# Patient Record
Sex: Female | Born: 1945 | Race: White | Hispanic: No | Smoking: Never smoker
Health system: Southern US, Community
[De-identification: ages and names within clinical notes are randomized; demographics above are authoritative.]

## PROBLEM LIST (undated history)

## (undated) DIAGNOSIS — K219 Gastro-esophageal reflux disease without esophagitis: Secondary | ICD-10-CM

## (undated) DIAGNOSIS — N83209 Unspecified ovarian cyst, unspecified side: Secondary | ICD-10-CM

## (undated) DIAGNOSIS — C50919 Malignant neoplasm of unspecified site of unspecified female breast: Secondary | ICD-10-CM

## (undated) DIAGNOSIS — R0600 Dyspnea, unspecified: Secondary | ICD-10-CM

## (undated) DIAGNOSIS — M419 Scoliosis, unspecified: Secondary | ICD-10-CM

## (undated) DIAGNOSIS — G20 Parkinson's disease: Secondary | ICD-10-CM

## (undated) DIAGNOSIS — F419 Anxiety disorder, unspecified: Secondary | ICD-10-CM

## (undated) DIAGNOSIS — M810 Age-related osteoporosis without current pathological fracture: Secondary | ICD-10-CM

## (undated) HISTORY — PX: CYSTOCELE REPAIR: SHX163

## (undated) HISTORY — PX: BREAST LUMPECTOMY: SHX2

## (undated) HISTORY — PX: WISDOM TOOTH EXTRACTION: SHX21

## (undated) HISTORY — PX: ABDOMINAL HYSTERECTOMY: SHX81

## (undated) HISTORY — PX: CHOLECYSTECTOMY: SHX55

## (undated) HISTORY — DX: Malignant neoplasm of unspecified site of unspecified female breast: C50.919

## (undated) HISTORY — PX: BREAST BIOPSY: SHX20

## (undated) HISTORY — DX: Anxiety disorder, unspecified: F41.9

## (undated) HISTORY — DX: Unspecified ovarian cyst, unspecified side: N83.209

---

## 1999-01-28 ENCOUNTER — Encounter: Payer: Self-pay | Admitting: Emergency Medicine

## 1999-01-28 ENCOUNTER — Emergency Department (HOSPITAL_COMMUNITY): Admission: EM | Admit: 1999-01-28 | Discharge: 1999-01-28 | Payer: Self-pay | Admitting: Emergency Medicine

## 1999-03-13 ENCOUNTER — Other Ambulatory Visit: Admission: RE | Admit: 1999-03-13 | Discharge: 1999-03-13 | Payer: Self-pay | Admitting: *Deleted

## 1999-05-25 ENCOUNTER — Encounter: Payer: Self-pay | Admitting: Surgery

## 1999-06-02 ENCOUNTER — Observation Stay (HOSPITAL_COMMUNITY): Admission: RE | Admit: 1999-06-02 | Discharge: 1999-06-03 | Payer: Self-pay | Admitting: Surgery

## 1999-06-02 ENCOUNTER — Encounter (INDEPENDENT_AMBULATORY_CARE_PROVIDER_SITE_OTHER): Payer: Self-pay

## 2000-03-25 ENCOUNTER — Other Ambulatory Visit: Admission: RE | Admit: 2000-03-25 | Discharge: 2000-03-25 | Payer: Self-pay | Admitting: *Deleted

## 2001-03-20 ENCOUNTER — Other Ambulatory Visit: Admission: RE | Admit: 2001-03-20 | Discharge: 2001-03-20 | Payer: Self-pay | Admitting: *Deleted

## 2001-08-08 ENCOUNTER — Encounter: Admission: RE | Admit: 2001-08-08 | Discharge: 2001-08-08 | Payer: Self-pay | Admitting: Urology

## 2001-08-08 ENCOUNTER — Encounter: Payer: Self-pay | Admitting: Urology

## 2003-02-07 ENCOUNTER — Other Ambulatory Visit: Admission: RE | Admit: 2003-02-07 | Discharge: 2003-02-07 | Payer: Self-pay | Admitting: *Deleted

## 2005-08-17 ENCOUNTER — Other Ambulatory Visit: Admission: RE | Admit: 2005-08-17 | Discharge: 2005-08-17 | Payer: Self-pay | Admitting: Obstetrics & Gynecology

## 2005-12-08 ENCOUNTER — Ambulatory Visit (HOSPITAL_COMMUNITY): Admission: RE | Admit: 2005-12-08 | Discharge: 2005-12-09 | Payer: Self-pay | Admitting: Obstetrics and Gynecology

## 2008-05-14 ENCOUNTER — Ambulatory Visit (HOSPITAL_COMMUNITY): Admission: RE | Admit: 2008-05-14 | Discharge: 2008-05-15 | Payer: Self-pay | Admitting: Urology

## 2010-08-24 LAB — TYPE AND SCREEN
ABO/RH(D): O POS
Antibody Screen: NEGATIVE

## 2010-08-24 LAB — CBC
HCT: 34.8 % — ABNORMAL LOW (ref 36.0–46.0)
Hemoglobin: 11.6 g/dL — ABNORMAL LOW (ref 12.0–15.0)
MCHC: 33.5 g/dL (ref 30.0–36.0)
MCHC: 33.6 g/dL (ref 30.0–36.0)
MCV: 100.8 fL — ABNORMAL HIGH (ref 78.0–100.0)
MCV: 101.1 fL — ABNORMAL HIGH (ref 78.0–100.0)
MCV: 103 fL — ABNORMAL HIGH (ref 78.0–100.0)
Platelets: 216 10*3/uL (ref 150–400)
RBC: 3.15 MIL/uL — ABNORMAL LOW (ref 3.87–5.11)
RBC: 3.44 MIL/uL — ABNORMAL LOW (ref 3.87–5.11)
RBC: 3.61 MIL/uL — ABNORMAL LOW (ref 3.87–5.11)
RDW: 13 % (ref 11.5–15.5)
WBC: 10.2 10*3/uL (ref 4.0–10.5)
WBC: 8.6 10*3/uL (ref 4.0–10.5)

## 2010-08-24 LAB — BASIC METABOLIC PANEL
BUN: 13 mg/dL (ref 6–23)
CO2: 26 mEq/L (ref 19–32)
Calcium: 8.6 mg/dL (ref 8.4–10.5)
Chloride: 103 mEq/L (ref 96–112)
Creatinine, Ser: 0.83 mg/dL (ref 0.4–1.2)
Glucose, Bld: 163 mg/dL — ABNORMAL HIGH (ref 70–99)

## 2010-08-24 LAB — ABO/RH: ABO/RH(D): O POS

## 2010-09-22 NOTE — Op Note (Signed)
Mary Simpson, HOOPES                ACCOUNT NO.:  [id number]   MEDICAL RECORD NO.:  [id number]          PATIENT TYPE:  AMB   LOCATION:  DAY                          FACILITY:  [facility]   PHYSICIAN:  Martina Sinner, MD DATE OF BIRTH:  [birth year]   DATE OF PROCEDURE:  05/14/2008  DATE OF DISCHARGE:                               OPERATIVE REPORT   PREOPERATIVE DIAGNOSES:  1. Cystocele.  2. Vault prolapse.  3. Pelvic pain.   POSTOPERATIVE DIAGNOSES:  1. Cystocele.  2. Vault prolapse.  3. Interstitial cystitis.   SURGERY:  Vault suspension plus cystocele repair plus graft plus  cystoscopy plus hydrodistention.   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  She was feared to have a  prolapse repair.  She had had an A and P repair in the past.   The patient is prepped and draped in usual fashion.  Extra care was  taken with leg position to minimize the risk of compartment syndrome,  neuropathy and DVT.  Preoperative lab tests were normal.  Positive blood  cultures treated with Bactrim followed by gentamicin and Unasyn  appropriately.  There is no question she had some vaginal shortening at  rest.  She had a cone effect at the apex of her vagina that I could  insert 3 fingers into but it was tight.  This in my opinion is from her  previous hysterectomy followed by an A and P repair.  I could identify  the dimples.  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  She had a very short anterior vaginal wall and was  very short to the pelvic sidewall.  Three-0 Vicryl was placed at the  dimple.   I used a T-shaped incision between 2 Allis clamps and used my usual  technique after instilling 20 mL of a lidocaine/epinephrine mixture.  I  sharply dissected the vaginal wall mucosa from the pubocervical fascia  bilaterally mobilizing the fascia at the apex and all the way to the  pelvic sidewall.  Again, the length and width of the dissection was  surprisingly small.  At first, I was not sure if I was going to be able  to fix much of the defect.   I decided to do a gentle one-layer imbrication anteriorly, avoiding the  bladder neck.  This was followed by 3 interrupted sutures without  further imbrication.   I then cystoscoped the patient.  There was good ureteral jets  bilaterally.  There is no injury to the bladder.   I then finger dissected the ischial spine bilaterally.  She had nice  spines, and the sacrospinous ligament was easily palpable.  Using a  catheter device, I placed a 0 Ethibond 1-1.5 cm cephalad and 1-cm  posterior to the ischial spine.  I double checked the position and was  very pleased with it.  I then placed a 0 Vicryl on a PT1  needle through  the pelvic sidewall near the urethrovesical angle.   I cut my 10 x 6 dermal graft.  This was sewn into the floor, sutures  described.  It went in beautifully.  I split it in the midline a half a  centimeter to avoid any restriction at the level of the bladder neck.   A few millimeters was removed in the middle aspect of the anterior  vaginal wall mucosa.  Two-0 Vicryl was used to close the anterior  vaginal wall incision.  Copious irrigation was utilized.   The cone effect at the top of the vagina was a little bit tighter, but I  could insert 3 fingers.  I did not trim any vaginal wall mucosa at this  level.  I was very careful to close the mucosa, passing my needle  through the edges, not to narrow further.  In my opinion, the further  narrowing was due to closing the T-shaped incision and not from deeper  dissection.   Vaginal pack with Estrace was inserted.   Visibly and palpably, I was very pleased with the operation and  hopefully will reach the treatment goal.           ______________________________  Martina Sinner, MD  Electronically Signed     SAM/MEDQ  D:  05/14/2008  T:  05/14/2008  Job:  [id number]

## 2010-09-22 NOTE — Op Note (Signed)
Mary Simpson, Mary Simpson                ACCOUNT NO.:  [id number]   MEDICAL RECORD NO.:  [id number]          PATIENT TYPE:  OIB   LOCATION:  1401                         FACILITY:  [facility]   PHYSICIAN:  Martina Sinner, MD DATE OF BIRTH:  1945-07-12   DATE OF PROCEDURE:  05/14/2008  DATE OF DISCHARGE:                               OPERATIVE REPORT   PREOPERATIVE DIAGNOSES:  Pelvic prolapse, pelvic pain.   POSTOPERATIVE DIAGNOSES:  Pelvic prolapse, interstitial cystitis.   TITLE OF PROCEDURE:  Cystoscopy with hydrodistention.   I have dictated a lengthy note regarding this patient's surgery.  At the  beginning of the case, I did a cystoscopy.  Bladder mucosa and trigone  were normal.  There was no__________ of bladder carcinoma.  She was  hydrodistended to 700cc.  I emptied the bladder and re-examined the  bladder and she had a few glomerulations of mild to moderate degree.  Quite frankly, this surprised me and I will take this into consideration  when treating her.  She does have a pain syndrome which I thought was  more of a vaginitis.  She had cystoscopic findings in keeping with the  diagnosis of interstitial cystitis.           ______________________________  Martina Sinner, MD  Electronically Signed     SAM/MEDQ  D:  05/14/2008  T:  05/14/2008  Job:  [id number]

## 2010-09-25 NOTE — Op Note (Signed)
NAMEDNIYA, NEUHAUS                ACCOUNT NO.:  [id number]   MEDICAL RECORD NO.:  [id number]          PATIENT TYPE:  AMB   LOCATION:  SDC                           FACILITY:  [facility]   PHYSICIAN:  Michelle L. Grewal, M.D.DATE OF BIRTH:  December 10, 1945   DATE OF PROCEDURE:  12/08/2005  DATE OF DISCHARGE:                                 OPERATIVE REPORT   PREOPERATIVE DIAGNOSIS:  Cystocele and rectocele.   POSTOPERATIVE DIAGNOSIS:  Cystocele and rectocele.   PROCEDURE:  Anterior and posterior repair.   SURGEON:  Dr. Marcelle Overlie.   ANESTHESIA:  General.   ESTIMATED BLOOD LOSS:  Minimal.   COMPLICATIONS:  None.   PROCEDURE:  The patient is taken to the operating room.  She is intubated  without difficulty.  She is prepped and draped in usual sterile fashion.  Exam under anesthesia revealed grade 3 cystocele and high rectocele grade 1  to 2.  She had excellent vaginal vault support and excellent support of the  perineum.  A Foley catheter is inserted.  A speculum was placed in the  vagina and Allis clamps were placed up near the vaginal apex on either side  of the cystocele.  A small incision was made and a midline incision is made  in the anterior vaginal vault and carried over to the UV junction.  The  cystocele was reduced using sharp and blunt dissection with excellent  hemostasis.  It was then reduced using two pursestring sutures using O  Vicryl suture.  We then trimmed the redundant vaginal epithelium and then  closed the vaginal vault using 2-0 Vicryl continuous running locked stitch.  At this point an exam revealed she did have some bulging of the posterior  vaginal wall near the apex, but it was not an enterocele.  It appeared to be  high rectocele.  She had a history of posterior repair in the past and I  think what had happened is that the posterior pair did not go all the way up  to the vaginal vault in the back.  So this area just in the area of the  rectocele, we did a  sight directed posterior repair by making a small  midline incision in the posterior vaginal vault over the area of the  rectocele, reduced it using sharp and blunt dissection, closed it using  interrupted 0 Vicryl suture and trimmed the redundant tissue using  Metzenbaum. That incision was closed using 2-0 Vicryl in continuous running  locked stitch, too.  Hemostasis remained excellent. Vaginal packing with  Estrace cream was inserted to the vagina.  The patient tolerated procedure  well.  All sponge, lap and instrument counts were correct x2.  She went to  recovery room in stable condition.      Michelle L. Vincente Poli, M.D.  Electronically Signed    MLG/MEDQ  D:  12/08/2005  T:  12/08/2005  Job:  [id number]

## 2010-09-25 NOTE — Discharge Summary (Signed)
Mary Simpson, Mary Simpson                ACCOUNT NO.:  [id number]   MEDICAL RECORD NO.:  [id number]          PATIENT TYPE:  OIB   LOCATION:  9318                          FACILITY:  [facility]   PHYSICIAN:  Michelle L. Grewal, M.D.DATE OF BIRTH:  [birth year]   DATE OF ADMISSION:  12/08/2005  DATE OF DISCHARGE:  12/09/2005                                 DISCHARGE SUMMARY   ADMISSION DIAGNOSIS:  Cystocele and rectocele.   DISCHARGE DIAGNOSIS:  Cystocele and rectocele.   HOSPITAL COURSE:  The patient is a 65 year old gravida 4, para 4, who is  admitted for treatment of symptomatic cystocele and rectocele.  On the day  of admission, she undergoes an anterior and posterior repair.  She did very  well with the procedure.  She did have a catheter in until the following  day, postoperative day #1.  The patient initially had some urinary  retention, but then after an in-and-out catheter, was able to urinate  without any difficulty.  She had minimal pain and required only ibuprofen.  She was discharged home in the afternoon of postoperative day #1 in  excellent condition.  She was advised to use ibuprofen as needed for pain.  She was advised to do her Sitz-baths twice daily until she sees me in the  office in 1-2 weeks.  She was advised to call if she has any inability to  urinate, constipation, temperature greater than 100.5, nausea, vomiting, or  heavy vaginal bleeding.      Michelle L. Vincente Poli, M.D.  Electronically Signed     MLG/MEDQ  D:  02/17/2006  T:  02/17/2006  Job:  [id number]

## 2011-02-12 LAB — CBC
HCT: 35.5 % — ABNORMAL LOW (ref 36.0–46.0)
MCV: 100.6 fL — ABNORMAL HIGH (ref 78.0–100.0)
Platelets: 229 10*3/uL (ref 150–400)
RBC: 3.53 MIL/uL — ABNORMAL LOW (ref 3.87–5.11)
WBC: 5 10*3/uL (ref 4.0–10.5)

## 2011-02-12 LAB — APTT: aPTT: 28 seconds (ref 24–37)

## 2011-02-12 LAB — PROTIME-INR: Prothrombin Time: 13.5 seconds (ref 11.6–15.2)

## 2017-05-31 DIAGNOSIS — S43402A Unspecified sprain of left shoulder joint, initial encounter: Secondary | ICD-10-CM | POA: Diagnosis not present

## 2017-05-31 DIAGNOSIS — K2270 Barrett's esophagus without dysplasia: Secondary | ICD-10-CM | POA: Diagnosis not present

## 2017-05-31 DIAGNOSIS — E785 Hyperlipidemia, unspecified: Secondary | ICD-10-CM | POA: Diagnosis not present

## 2017-06-01 DIAGNOSIS — R739 Hyperglycemia, unspecified: Secondary | ICD-10-CM | POA: Diagnosis not present

## 2017-06-02 DIAGNOSIS — M816 Localized osteoporosis [Lequesne]: Secondary | ICD-10-CM | POA: Diagnosis not present

## 2017-06-02 DIAGNOSIS — Z01419 Encounter for gynecological examination (general) (routine) without abnormal findings: Secondary | ICD-10-CM | POA: Diagnosis not present

## 2017-06-02 DIAGNOSIS — Z6833 Body mass index (BMI) 33.0-33.9, adult: Secondary | ICD-10-CM | POA: Diagnosis not present

## 2017-06-02 DIAGNOSIS — N8189 Other female genital prolapse: Secondary | ICD-10-CM | POA: Diagnosis not present

## 2017-06-02 DIAGNOSIS — Z1231 Encounter for screening mammogram for malignant neoplasm of breast: Secondary | ICD-10-CM | POA: Diagnosis not present

## 2017-06-02 DIAGNOSIS — N958 Other specified menopausal and perimenopausal disorders: Secondary | ICD-10-CM | POA: Diagnosis not present

## 2017-07-12 DIAGNOSIS — N390 Urinary tract infection, site not specified: Secondary | ICD-10-CM | POA: Diagnosis not present

## 2017-07-21 DIAGNOSIS — H43812 Vitreous degeneration, left eye: Secondary | ICD-10-CM | POA: Diagnosis not present

## 2017-09-01 DIAGNOSIS — H43812 Vitreous degeneration, left eye: Secondary | ICD-10-CM | POA: Diagnosis not present

## 2017-09-21 DIAGNOSIS — R109 Unspecified abdominal pain: Secondary | ICD-10-CM | POA: Diagnosis not present

## 2017-09-21 DIAGNOSIS — M810 Age-related osteoporosis without current pathological fracture: Secondary | ICD-10-CM | POA: Diagnosis not present

## 2017-09-21 DIAGNOSIS — K219 Gastro-esophageal reflux disease without esophagitis: Secondary | ICD-10-CM | POA: Diagnosis not present

## 2017-09-21 DIAGNOSIS — Z6829 Body mass index (BMI) 29.0-29.9, adult: Secondary | ICD-10-CM | POA: Diagnosis not present

## 2017-09-21 DIAGNOSIS — Z1331 Encounter for screening for depression: Secondary | ICD-10-CM | POA: Diagnosis not present

## 2017-09-21 DIAGNOSIS — K5900 Constipation, unspecified: Secondary | ICD-10-CM | POA: Diagnosis not present

## 2017-09-21 DIAGNOSIS — Z79899 Other long term (current) drug therapy: Secondary | ICD-10-CM | POA: Diagnosis not present

## 2017-09-21 DIAGNOSIS — R5383 Other fatigue: Secondary | ICD-10-CM | POA: Diagnosis not present

## 2017-09-21 DIAGNOSIS — Z9181 History of falling: Secondary | ICD-10-CM | POA: Diagnosis not present

## 2017-09-28 DIAGNOSIS — K5900 Constipation, unspecified: Secondary | ICD-10-CM | POA: Diagnosis not present

## 2017-09-28 DIAGNOSIS — K219 Gastro-esophageal reflux disease without esophagitis: Secondary | ICD-10-CM | POA: Diagnosis not present

## 2017-09-28 DIAGNOSIS — Z800 Family history of malignant neoplasm of digestive organs: Secondary | ICD-10-CM | POA: Diagnosis not present

## 2017-10-14 DIAGNOSIS — Z79899 Other long term (current) drug therapy: Secondary | ICD-10-CM | POA: Diagnosis not present

## 2017-10-14 DIAGNOSIS — K210 Gastro-esophageal reflux disease with esophagitis: Secondary | ICD-10-CM | POA: Diagnosis not present

## 2017-10-14 DIAGNOSIS — K219 Gastro-esophageal reflux disease without esophagitis: Secondary | ICD-10-CM | POA: Diagnosis not present

## 2017-10-14 DIAGNOSIS — K2970 Gastritis, unspecified, without bleeding: Secondary | ICD-10-CM | POA: Diagnosis not present

## 2017-10-14 DIAGNOSIS — K2270 Barrett's esophagus without dysplasia: Secondary | ICD-10-CM | POA: Diagnosis not present

## 2017-10-14 DIAGNOSIS — K449 Diaphragmatic hernia without obstruction or gangrene: Secondary | ICD-10-CM | POA: Diagnosis not present

## 2017-12-05 DIAGNOSIS — F411 Generalized anxiety disorder: Secondary | ICD-10-CM | POA: Diagnosis not present

## 2017-12-05 DIAGNOSIS — M19242 Secondary osteoarthritis, left hand: Secondary | ICD-10-CM | POA: Diagnosis not present

## 2018-01-05 DIAGNOSIS — R251 Tremor, unspecified: Secondary | ICD-10-CM | POA: Diagnosis not present

## 2018-01-05 DIAGNOSIS — Z1339 Encounter for screening examination for other mental health and behavioral disorders: Secondary | ICD-10-CM | POA: Diagnosis not present

## 2018-01-05 DIAGNOSIS — M62838 Other muscle spasm: Secondary | ICD-10-CM | POA: Diagnosis not present

## 2018-01-05 DIAGNOSIS — Z6825 Body mass index (BMI) 25.0-25.9, adult: Secondary | ICD-10-CM | POA: Diagnosis not present

## 2018-01-12 ENCOUNTER — Telehealth: Payer: Self-pay | Admitting: Neurology

## 2018-01-12 ENCOUNTER — Encounter: Payer: Self-pay | Admitting: Neurology

## 2018-01-12 ENCOUNTER — Ambulatory Visit: Payer: Medicare HMO | Admitting: Neurology

## 2018-01-12 VITALS — BP 167/95 | HR 98

## 2018-01-12 DIAGNOSIS — F419 Anxiety disorder, unspecified: Secondary | ICD-10-CM | POA: Diagnosis not present

## 2018-01-12 DIAGNOSIS — G20 Parkinson's disease: Secondary | ICD-10-CM

## 2018-01-12 NOTE — Telephone Encounter (Signed)
DAT Scan pending insurance approval .

## 2018-01-12 NOTE — Patient Instructions (Signed)
I think you have signs and symptoms of mild parkinsonism, possibly L sided Parkinson's disease.   Please continue with your healthy lifestyle.   I do want to suggest a few things today:  Please be really proactive with your constipation medication regimen, titrating as needed to where you have a formed stool at least every other day.   Remember to drink plenty of fluid at least 6 glasses (8 oz each), eat healthy meals and do not skip any meals. Try to eat protein with a every meal and eat a healthy snack such as fruit or nuts in between meals. Try to keep a regular sleep-wake schedule and try to exercise daily, particularly in the form of walking, 20-30 minutes a day, if you can.   Try to stay active physically and mentally. Engage in social activities in your community and with your family and try to keep up with current events by reading the newspaper or watching the news. Try to do word puzzles and you may like to do puzzles and brain games on the computer such as on [email or web address]   As far as diagnostic testing, I will order a DaT scan: This is a specialized brain scan designed to help with diagnosis of tremor disorders. A radioactive marker gets injected and the uptake is measured in the brain and compared to normal controls and right side is compared to the left, a change in uptake can help with diagnosis of certain tremor disorders. A brain MRI on the other hand is a brain scan that helps look at the brain structure in more detail overall and look for age-related changes, blood vessel related changes and look for stroke and volume loss which we call atrophy.   I would like to see you back in 4 months, sooner if we need to. Please call us with any interim questions, concerns, problems, updates or refill requests.  Our phone number is [phone]. We also have an after hours call service for urgent matters and there is a physician on-call for urgent questions, that cannot wait till the next work  day. For any emergencies you know to call 911 or go to the nearest emergency room.

## 2018-01-12 NOTE — Progress Notes (Signed)
Subjective:    Patient ID: Mary Simpson is a 72 y.o. female.  HPI     Star Age, MD, PhD [facility] [address], [address] [place],  [address]  Dear Dr. Helene Kelp,   I saw your patient, Mary Simpson, upon your kind request in my neurologic clinic today for initial consultation of her tremors. The patient is unaccompanied today. As you know, Mary Simpson is a 72 year old L-handed woman with an underlying medical history of reflux disease, anxiety, and borderline overweight state, who reports a L upper extremity tremor for the past 6 months. I reviewed your office note from 01/05/2018, which you kindly included. She had recent blood work through your office which I was able to review: CBC with differential was unremarkable, she also had additional blood work including CMP, TSH and B12, we will request test results with those test as well. She reports exacerbation with stress. She lives alone, she is retired, has 3 grown children. She is a nonsmoker and does not utilize alcohol and drinks caffeine infrequently.  She has no family history of Parkinson's disease or tremors. Her anxiety level increased quite a bit of the past few months. She had 2 panic attacks she reports. Some of her stress is related to new neighbors that like loud music she admits. She also admits that she became quite upset when she looked up Parkinson's disease online. She has been taking melatonin at night for sleep. She has trouble sleeping for quite some time. She takes a fraction of a pill from the Flexeril she has to relax her at night. She has been divorced for over 16 years, has 3 sons, 9 grandchildren and 6 great-grandchildren. Her sons all live in [place], 1 and [place], one in [place]. Or tremor was mild and intermittent before, it is now fairly consistent. She has a resting tremor typically. She has not noticed much in the way of difference between right hand and left hand  in terms of fine motor skills. She has not noticed any balance issues. She is active, particularly at church. She is very independent and would like to stay independent. She has never had a brain scan. She was told that she had scoliosis as a teenager. She has a family history of scoliosis.  Her Past Medical History Is Significant For: Past Medical History:  Diagnosis Date  . Ovarian cyst    Her Past Surgical History Is Significant For:  His Family History Is Significant For: Family History  Problem Relation Age of Onset  . Colon cancer Sister     His Social History Is Significant For: Social History   Socioeconomic History  . Marital status: Divorced    Spouse name: Not on file  . Number of children: Not on file  . Years of education: Not on file  . Highest education level: Not on file  Occupational History  . Not on file  Social Needs  . Financial resource strain: Not on file  . Food insecurity:    Worry: Not on file    Inability: Not on file  . Transportation needs:    Medical: Not on file    Non-medical: Not on file  Tobacco Use  . Smoking status: Never Smoker  . Smokeless tobacco: Never Used  Substance and Sexual Activity  . Alcohol use: Never    Frequency: Never  . Drug use: Not on file  . Sexual activity: Not on file  Lifestyle  . Physical  activity:    Days per week: Not on file    Minutes per session: Not on file  . Stress: Not on file  Relationships  . Social connections:    Talks on phone: Not on file    Gets together: Not on file    Attends religious service: Not on file    Active member of club or organization: Not on file    Attends meetings of clubs or organizations: Not on file    Relationship status: Not on file  Other Topics Concern  . Not on file  Social History Narrative  . Not on file    His Allergies Are:  No Known Allergies:   His Current Medications Are:  Outpatient Encounter Medications as of 01/12/2018  Medication Sig  .  cyclobenzaprine (FLEXERIL) 10 MG tablet Take 5 mg by mouth at bedtime.  Marland Kitchen MELATONIN PO Take by mouth.  . pantoprazole (PROTONIX) 40 MG tablet Take 40 mg by mouth daily.  . Pediatric Multivitamins-Iron (MULTI-VITAMIN DROPS/FE PO) Take by mouth.   No facility-administered encounter medications on file as of 01/12/2018.   :   Review of Systems:  Out of a complete 14 point review of systems, all are reviewed and negative with the exception of these symptoms as listed below:  Review of Systems  Neurological:       Pt presents today to discuss her left handed tremor that has been present for 6 months. Pt's tremor is exacerbated by stress. Pt is left handed.    Objective:  Neurological Exam  Physical Exam Physical Examination:   Vitals:   01/12/18 0903  BP: (!) 167/95  Pulse: 98    General Examination: The patient is a very pleasant 72 y.o. female in no acute distress. She appears well-developed and well-nourished and well groomed. She is mildly anxious appearing.  HEENT: Normocephalic, atraumatic, pupils are equal, round and reactive to light and accommodation. Corrective eyeglasses in place. Extra ocular tracking is fairly good. She has very mild facial masking and slight decrease in eye blink rate. She has mild nuchal rigidity. She has no lip, neck or jaw tremor. Maybe mild hypophonia but no dysarthria, hearing is grossly intact. Face is symmetric. Airway examination is benign, tongue protrudes centrally and palate elevates symmetrically. No sialorrhea.  Chest: Clear to auscultation without wheezing, rhonchi or crackles noted.  Heart: S1+S2+0, regular and normal without murmurs, rubs or gallops noted.   Abdomen: Soft, non-tender and non-distended with normal bowel sounds appreciated on auscultation.  Extremities: There is no pitting edema in the distal lower extremities bilaterally. Pedal pulses are intact.  Skin: Warm and dry without trophic changes noted.  Musculoskeletal: exam  reveals no obvious joint deformities, tenderness or joint swelling or erythema.   Neurologically:  Mental status: The patient is awake, alert and oriented in all 4 spheres. Her immediate and remote memory, attention, language skills and fund of knowledge are appropriate. There is no evidence of aphasia, agnosia, apraxia or anomia. Speech is clear with normal prosody and enunciation. Thought process is linear. Mood is normal and affect is normal.  Cranial nerves II - XII are as described above under HEENT exam.  Right shoulder is slightly higher when she first stands up or when she sits.  Motor exam: Normal bulk, strength and Mild increase in tone is noted with contralateral activation only.she has a mild resting tremor in the left upper extremity only. She has a mild postural tremor which occurs with some delay and no significant  action tremor, no intention tremor. There is no drift, or rebound.  On 01/12/2018: on Archimedes spiral drawing she has mild insecurity with her right hand which is nondominant, she had minimal trembling with her left hand, handwriting is legible, not particularly tremulous, slightly on the smaller side.  Romberg is negative. Reflexes are 2+ throughout. Fine motor skills and coordination: she has mild difficulty with finger taps and foot taps as well as rapid alternating patting and hand movements on the left, minimal difficulty her no significant difficulty on the right. Cerebellar testing: No dysmetria or intention tremor on finger to nose testing. There is no truncal or gait ataxia.  Sensory exam: intact to light touch.  Gait, station and balance: She stands with no significant difficulty, posture is slightly stooped for age and slight hint of scoliosis is noted when she stands. She walks with slight decrease in pace, fairly good stride length, decrease in arm swing on the left. She turns well. Balance seems fairly preserved. More stooped and more evidence of scoliosis as she  walks.  Assessment and Plan:    In summary, MARQUETA PULLEY is a very pleasant 72 y.o.-year old female with an underlying medical history of reflux disease, anxiety, and borderline overweight state, whopresents for evaluation of her tremor. She reports a six-month tremor in the left upper extremity which started gradually and has become more consistent. Her history and physical exam show signs of mild parkinsonism, possibly tremor predominant left her Parkinson's disease. The patient is understandably upset with this possibility. She has underlying anxiety and reports recent panic attacks. She is strongly encouraged to talk to about her anxiety and stress management. She has had difficulty sleeping and uses a portion of a pill of Flexeril as needed to help her relax at night, she has also tried melatonin. I would not recommend any prescription sleep aid. Your side effects and especially since she lives alone.  I had a long chat with the patient about Her symptoms, my findings and the diagnosis of parkinsonism/Parksinson's disease, its prognosis and treatment options. We talked about medical treatments and non-pharmacological approaches. We talked about maintaining a healthy lifestyle in general. I encouraged the patient to eat healthy, exercise daily and keep well hydrated, to keep a scheduled bedtime and wake time routine, to not skip any meals and eat healthy snacks in between meals and to have protein with every meal. In particular, I stressed the importance of regular exercise, within of course the patient's own mobility limitations.   As far as further diagnostic testing is concerned, I suggested: I would like to pursue a DaT scan. Informed consent was obtained today and paperwork was signed, I explained the test to her separately in detail as well. As far as medications are concerned, I recommended the following at this time: no change. We will talk about details of potential symptomatic treatments down  the [place]. I answered all her questions today and the patient was in agreement with the above outlined plan. I would like to see the patient back in 4 months, sooner if the need arises and we will keep her posted in the interim regarding her scan results. Thank you very much for allowing me to participate in the care of this nice patient. If I can be of any further assistance to you please do not hesitate to call me at [phone].  Sincerely,   Star Age, MD, PhD

## 2018-01-17 ENCOUNTER — Telehealth: Payer: Self-pay | Admitting: Neurology

## 2018-01-17 NOTE — Telephone Encounter (Signed)
Thank you, nothing further needed.  

## 2018-01-17 NOTE — Telephone Encounter (Signed)
I called pt. She reports that her gynecologist prescribed flexeril for spasms and tremors. Pt has taken flexeril daily for the past few days. Today, after taking it, she said her heart started to race and she felt like she was having a panic attack. She reached out to her PCP and was told to contact us. I advised her that we didn't prescribe the flexeril for tremors and asked her to contact her gynecologist regarding this. I asked pt to follow up with her PCP regarding her heart racing and panic attacks as well and explained that any heart abnormality should not be taken lightly. Pt verbalized understanding.  Pt asked if there was another medication that she can take for her tremors. She is having trouble sleeping due to the tremors. I advised her that Dr. Rexene Alberts would need to see the DAT scan results and then may consider adding medications. We are in the process of insurance approval for this and will be in touch. Pt is agreeable to waiting for the DAT scan results. Pt will follow up with her PCP regarding her heart racing and panic attacks as well as her OBGYN.

## 2018-01-17 NOTE — Telephone Encounter (Signed)
Pt called stating this morning after taking cyclobenzaprine (FLEXERIL) 10 MG tablet her heart started racing. Stating she felt she was was having a panic attack. Requesting a call to discuss different medication.

## 2018-01-31 DIAGNOSIS — Z6825 Body mass index (BMI) 25.0-25.9, adult: Secondary | ICD-10-CM | POA: Diagnosis not present

## 2018-01-31 DIAGNOSIS — F410 Panic disorder [episodic paroxysmal anxiety] without agoraphobia: Secondary | ICD-10-CM | POA: Diagnosis not present

## 2018-01-31 DIAGNOSIS — Z23 Encounter for immunization: Secondary | ICD-10-CM | POA: Diagnosis not present

## 2018-02-03 ENCOUNTER — Telehealth: Payer: Self-pay | Admitting: Neurology

## 2018-02-03 NOTE — Telephone Encounter (Signed)
I spoke with Dr. Rexene Alberts. There are no meds on pt's list currently that she needs to stop taking prior to the DAT scan.  I called pt. Her current med list is correct except for the addition of citalopram last Tuesday. I advised her that she does not need to stop taking any meds prior to her DAT scan. Pt verbalized understanding.

## 2018-02-03 NOTE — Telephone Encounter (Signed)
Noted, no further recs. 

## 2018-02-03 NOTE — Telephone Encounter (Signed)
Pt would like to know if she needs to hold off on any of her medications for any length of time before her MRI.  Please call

## 2018-02-09 ENCOUNTER — Encounter (HOSPITAL_COMMUNITY)
Admission: RE | Admit: 2018-02-09 | Discharge: 2018-02-09 | Disposition: A | Discharge status: Home / Self Care | Payer: Medicare HMO | Source: Ambulatory Visit | Attending: Neurology | Admitting: Neurology

## 2018-02-09 DIAGNOSIS — G20 Parkinson's disease: Secondary | ICD-10-CM | POA: Insufficient documentation

## 2018-02-09 DIAGNOSIS — R251 Tremor, unspecified: Secondary | ICD-10-CM | POA: Diagnosis not present

## 2018-02-09 DIAGNOSIS — G20C Parkinsonism, unspecified: Secondary | ICD-10-CM

## 2018-02-09 MED ORDER — IODINE STRONG (LUGOLS) 5 % PO SOLN
0.8000 mL | Freq: Once | ORAL | Status: AC
  Administered 2018-02-09: 0.8 mL via ORAL

## 2018-02-09 MED ORDER — IOFLUPANE I 123 185 MBQ/2.5ML IV SOLN
4.6200 | Freq: Once | INTRAVENOUS | Status: AC
  Administered 2018-02-09: 4.62 via INTRAVENOUS

## 2018-02-16 NOTE — Progress Notes (Signed)
Please call patient about the recent DaT scan: This is a specialized brain scan designed to help with diagnosis of tremor disorders. A radioactive marker gets injected and the uptake is measured in the brain and compared to normal controls and right side is compared to the left. A change in uptake can help with diagnosis of certain tremor disorders and parkinsonism. Her scan indicated abnormal (as in lower) uptake as compared to normal uptake pattern, indicating a parkinsonian disorder. 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 We can follow up as planned and discuss treatment options. If she desires a sooner than scheduled appt, please see if we have openings for me. thx.  Michel Bickers

## 2018-02-20 ENCOUNTER — Telehealth: Payer: Self-pay

## 2018-02-20 NOTE — Telephone Encounter (Addendum)
I called Mary Simpson and discussed her DAT scan results. Mary Simpson has many questions about what is real Parkinson's disease versus atypical parkinsonism, and which one she has, and why. I advised her that I cannot tell her which one she has and why, this will be up to Dr. Rexene Alberts and Mary Simpson's clinical exam. I recommended a follow up appt to discuss all of her questions and concerns and a sooner appt was made for 03/27/18 at 2:00pm. Mary Simpson asked that I send her a letter with this appt date and time.

## 2018-02-20 NOTE — Telephone Encounter (Signed)
-----   Message from Star Age, MD sent at 02/16/2018  5:15 PM EDT ----- Please call patient about the recent DaT scan: This is a specialized brain scan designed to help with diagnosis of tremor disorders. A radioactive marker gets injected and the uptake is measured in the brain and compared to normal controls and right side is compared to the left. A change in uptake can help with diagnosis of certain tremor disorders and parkinsonism. Her scan indicated abnormal (as in lower) uptake as compared to normal uptake pattern, indicating a parkinsonian disorder. 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 We can follow up as planned and discuss treatment options. If she desires a sooner than scheduled appt, please see if we have openings for me. thx.  Michel Bickers

## 2018-02-27 DIAGNOSIS — Z6825 Body mass index (BMI) 25.0-25.9, adult: Secondary | ICD-10-CM | POA: Diagnosis not present

## 2018-02-27 DIAGNOSIS — G4700 Insomnia, unspecified: Secondary | ICD-10-CM | POA: Diagnosis not present

## 2018-02-27 DIAGNOSIS — F410 Panic disorder [episodic paroxysmal anxiety] without agoraphobia: Secondary | ICD-10-CM | POA: Diagnosis not present

## 2018-03-14 DIAGNOSIS — H524 Presbyopia: Secondary | ICD-10-CM | POA: Diagnosis not present

## 2018-03-27 ENCOUNTER — Encounter: Payer: Self-pay | Admitting: Neurology

## 2018-03-27 ENCOUNTER — Ambulatory Visit: Payer: Medicare HMO | Admitting: Neurology

## 2018-03-27 VITALS — BP 168/80 | HR 104 | Ht 61.5 in | Wt 136.0 lb

## 2018-03-27 DIAGNOSIS — G20 Parkinson's disease: Secondary | ICD-10-CM

## 2018-03-27 MED ORDER — CARBIDOPA-LEVODOPA 25-100 MG PO TABS
ORAL_TABLET | ORAL | 5 refills | Status: DC
Start: 2018-03-27 — End: 2018-05-11

## 2018-03-27 NOTE — Patient Instructions (Addendum)
Please stay active mentally and physically.   Let's try Sinemet (generic name: carbidopa-levodopa) 25/100 mg: Take half a pill twice daily (8 AM and noon) for one week, then half a pill 3 times a day (8 AM, noon, and 4 PM) for one week, then one pill 3 times a day thereafter. Please try to take the medication away from you mealtimes, that is, ideally either one hour before or 2 hours after your meal to ensure optimal absorption. The medication can interfere with the protein content of your meal and trying to the protein in your food and therefore not get fully absorbed.  Common side effects reported are: Nausea, vomiting, sedation, confusion, lightheadedness. Rare side effects include hallucinations, severe nausea or vomiting, diarrhea and significant drop in blood pressure especially when going from lying to standing or from sitting to standing.   Please take your Celexa (citalopram) daily.   We can seek a second opinion with Dr. Linus Mako at [facility].

## 2018-03-27 NOTE — Progress Notes (Signed)
Subjective:    Patient ID: Mary Simpson is a 72 y.o. female.  HPI     Interim history:   Mary Simpson is a 72 year old L-handed woman with an underlying medical history of reflux disease, anxiety, and borderline overweight state, who presents for follow-up consultation of her parkinsonism, after recent nuclear medicine scanning. The patient is accompanied by her son, Mary Simpson, today. I first met her on 01/12/2018 at the request of her primary care physician, at which time the patient reported a left upper extremity tremor. The past 6 months. She had features of parkinsonism. She was advised to proceed with a DaT scan. She had this on 02/09/2018: IMPRESSION: Decreased symmetric radiotracer activity within the posterior striata (putamen) is a pattern consistent with Parkinson's syndrome pathology.   We called with her with the test results.  Today, 03/27/2018: She reports feeling stable since her visit in September. The tremor subsides during sleep and starts after she wakes up. She has been anxious, has not consistently taking her citalopram, was advised by PCP to take it on a daily basis. She has tried Belsomra for sleep, which helps a little bit but melatonin also seems to help. She tries to stay active physically. She tries to hydrate well but likes to drink decaf tea.  The patient's allergies, current medications, family history, past medical history, past social history, past surgical history and problem list were reviewed and updated as appropriate.   Previously:  01/12/2018: (She) reports a L upper extremity tremor for the past 6 months. I reviewed your office note from 01/05/2018, which you kindly included. She had recent blood work through your office which I was able to review: CBC with differential was unremarkable, she also had additional blood work including CMP, TSH and B12, we will request test results with those test as well. She reports exacerbation with stress. She lives alone,  she is retired, has 3 grown children. She is a nonsmoker and does not utilize alcohol and drinks caffeine infrequently.  She has no family history of Parkinson's disease or tremors. Her anxiety level increased quite a bit of the past few months. She had 2 panic attacks she reports. Some of her stress is related to new neighbors that like loud music she admits. She also admits that she became quite upset when she looked up Parkinson's disease online. She has been taking melatonin at night for sleep. She has trouble sleeping for quite some time. She takes a fraction of a pill from the Flexeril she has to relax her at night. She has been divorced for over 52 years, has 3 sons, 9 grandchildren and 6 great-grandchildren. Her sons all live in [place], 1 and [place], one in [place]. Or tremor was mild and intermittent before, it is now fairly consistent. She has a resting tremor typically. She has not noticed much in the way of difference between right hand and left hand in terms of fine motor skills. She has not noticed any balance issues. She is active, particularly at church. She is very independent and would like to stay independent. She has never had a brain scan. She was told that she had scoliosis as a teenager. She has a family history of scoliosis.  Her Past Medical History Is Significant For: Past Medical History:  Diagnosis Date  . Ovarian cyst     Her Past Surgical History Is Significant For:   Her Family History Is Significant For: Family History  Problem Relation Age of Onset  .  Colon cancer Sister     Her Social History Is Significant For: Social History   Socioeconomic History  . Marital status: Divorced    Spouse name: Not on file  . Number of children: Not on file  . Years of education: Not on file  . Highest education level: Not on file  Occupational History  . Not on file  Social Needs  . Financial resource strain: Not on file  . Food insecurity:    Worry: Not  on file    Inability: Not on file  . Transportation needs:    Medical: Not on file    Non-medical: Not on file  Tobacco Use  . Smoking status: Never Smoker  . Smokeless tobacco: Never Used  Substance and Sexual Activity  . Alcohol use: Never    Frequency: Never  . Drug use: Not on file  . Sexual activity: Not on file  Lifestyle  . Physical activity:    Days per week: Not on file    Minutes per session: Not on file  . Stress: Not on file  Relationships  . Social connections:    Talks on phone: Not on file    Gets together: Not on file    Attends religious service: Not on file    Active member of club or organization: Not on file    Attends meetings of clubs or organizations: Not on file    Relationship status: Not on file  Other Topics Concern  . Not on file  Social History Narrative  . Not on file    Her Allergies Are:  No Known Allergies:   Her Current Medications Are:  Outpatient Encounter Medications as of 03/27/2018  Medication Sig  . BELSOMRA 15 MG TABS Take 15 mg by mouth at bedtime.  . citalopram (CELEXA) 10 MG tablet Take 10 mg by mouth daily.  . cyclobenzaprine (FLEXERIL) 10 MG tablet Take 5 mg by mouth at bedtime.  Marland Kitchen MELATONIN PO Take by mouth.  . pantoprazole (PROTONIX) 40 MG tablet Take 40 mg by mouth daily.  . Pediatric Multivitamins-Iron (MULTI-VITAMIN DROPS/FE PO) Take by mouth.   No facility-administered encounter medications on file as of 03/27/2018.   :  Review of Systems:  Out of a complete 14 point review of systems, all are reviewed and negative with the exception of these symptoms as listed below: Review of Systems  Neurological:       Pt presents today to discuss her DAT scan results.    Objective:  Neurological Exam  Physical Exam Physical Examination:   Vitals:   03/27/18 1356  BP: (!) 168/80  Pulse: (!) 104   General Examination: The patient is a very pleasant 72 y.o. female in no acute distress. She appears well-developed  and well-nourished and well groomed. Less anxious today.  HEENT: Normocephalic, atraumatic, pupils are equal, round and reactive to light and accommodation. Corrective eyeglasses in place. Extra ocular tracking is fairly good. She has very mild facial masking and slight decrease in eye blink rate. She has mild nuchal rigidity. She has no lip, neck or jaw tremor. Maybe mild hypophonia but no dysarthria, hearing is grossly intact. Face is symmetric. Airway examination is benign, tongue protrudes centrally and palate elevates symmetrically. No sialorrhea.  Chest: Clear to auscultation without wheezing, rhonchi or crackles noted.  Heart: S1+S2+0, regular and normal without murmurs, rubs or gallops noted.   Abdomen: Soft, non-tender and non-distended with normal bowel sounds appreciated on auscultation.  Extremities: There is no  pitting edema in the distal lower extremities bilaterally.  Skin: Warm and dry without trophic changes noted.  Musculoskeletal: exam reveals no obvious joint deformities, tenderness or joint swelling or erythema.   Neurologically:  Mental status: The patient is awake, alert and oriented in all 4 spheres. Her immediate and remote memory, attention, language skills and fund of knowledge are appropriate. There is no evidence of aphasia, agnosia, apraxia or anomia. Speech is clear with normal prosody and enunciation. Thought process is linear. Mood is normal and affect is normal.  Cranial nerves II - XII are as described above under HEENT exam.  Right shoulder is slightly higher when she first stands up or when she sits.  Motor exam: Normal bulk, strength and mild increase in tone in the LUE. She has a consistent LUE resting tremor. She has a mild postural tremor which occurs with some delay and no significant action tremor, no intention tremor. There is no drift, or rebound.   (On 01/12/2018: on Archimedes spiral drawing she has mild insecurity with her right hand which is  nondominant, she had minimal trembling with her left hand, handwriting is legible, not particularly tremulous, slightly on the smaller side.)  Fine motor skills and coordination: she has mild difficulty with finger taps and foot taps as well as rapid alternating patting and hand movements on the left, minimal difficulty on the right. Cerebellar testing: No dysmetria or intention tremor on finger to nose testing. There is no truncal or gait ataxia.  Sensory exam: intact to light touch.  Gait, station and balance: She stands with no significant difficulty, posture is slightly stooped for age and slight hint of scoliosis is noted when she stands. She walks with slight decrease in pace, fairly good stride length, decrease in arm swing on the left. She turns well. Balance seems fairly preserved. More stooped and more evidence of scoliosis as she walks.  Assessment and Plan:    In summary, Mary Simpson is a very pleasant 72 year old female with an underlying medical history of reflux disease, anxiety, and borderline overweight state, who presents for follow-up consultation of her tremor with an approximately 8 month history of left upper extremity tremor. History and physical examination are concerning for parkinsonism with left-sided predominance noted. She had a recent nuclear medicine DaT scan, which was supportive of a Parkinson's spectrum disorder. I had a long chat with the patient and her son about Her symptoms, my findings and the diagnosis of parkinsonism/Parksinson's disease, its prognosis and treatment options. We talked about medical treatments and non-pharmacological approaches. We talked about maintaining a healthy lifestyle in general. I encouraged the patient to eat healthy, exercise daily and keep well hydrated, to keep a scheduled bedtime and wake time routine, to not skip any meals and eat healthy snacks in between meals and to have protein with every meal. In particular, I stressed the  importance of regular exercise, within of course the patient's own mobility limitations.   As far as further diagnostic testing is concerned, I suggested: no further tests today. She would like to seek a second opinion, I placed a referral for her to see Dr. Linus Mako at [facility], her son would prefer to go to [facility] for this. As far as medications are concerned, I recommended the following at this time: trial of Sinemet generic with gradual titration, starting at 25-100 milligrams strength half a pill twice a day and gradually increase to 1 pill 3 times a day over the next couple  weeks. I talked her about this medication, side effects, expectations at length. Written instructions and a new prescription were provided. We also gave her some resources about PD in print . I suggested a 4 month follow-up, sooner if needed. I answered all their questions today and the patient and her son were in agreement. I spent 25 minutes in total face-to-face time with the patient, more than 50% of which was spent in counseling and coordination of care, reviewing test results, reviewing medication and discussing or reviewing the diagnosis of PD, its prognosis and treatment options. Pertinent laboratory and imaging test results that were available during this visit with the patient were reviewed by me and considered in my medical decision making (see chart for details).

## 2018-03-28 ENCOUNTER — Telehealth: Payer: Self-pay | Admitting: Neurology

## 2018-03-28 NOTE — Telephone Encounter (Signed)
Sent to Dr. Linus Mako office for First apt . First apt was Aug 5 th 2020 at 9:30 . Patient is on CX list Dr. Linus Mako has no opening's Telephone [phone] - fax [phone]

## 2018-05-11 ENCOUNTER — Other Ambulatory Visit: Payer: Self-pay

## 2018-05-11 MED ORDER — CARBIDOPA-LEVODOPA 25-100 MG PO TABS: 1.0000 | ORAL_TABLET | Freq: Three times a day (TID) | ORAL | 5 refills | Status: DC

## 2018-05-11 NOTE — Telephone Encounter (Signed)
Received refill request for pt's sinemet from [facility]. I called pt. She is taking sinemet 1 pill TID and it is working "ok" for her tremors. She would like the refill sent to [facility]. Pt verbalized understanding of refill.

## 2018-05-17 ENCOUNTER — Ambulatory Visit: Payer: Medicare HMO | Admitting: Neurology

## 2018-05-24 DIAGNOSIS — F410 Panic disorder [episodic paroxysmal anxiety] without agoraphobia: Secondary | ICD-10-CM | POA: Diagnosis not present

## 2018-05-24 DIAGNOSIS — Z6826 Body mass index (BMI) 26.0-26.9, adult: Secondary | ICD-10-CM | POA: Diagnosis not present

## 2018-05-29 DIAGNOSIS — Z6825 Body mass index (BMI) 25.0-25.9, adult: Secondary | ICD-10-CM | POA: Diagnosis not present

## 2018-05-29 DIAGNOSIS — J329 Chronic sinusitis, unspecified: Secondary | ICD-10-CM | POA: Diagnosis not present

## 2018-05-29 DIAGNOSIS — J40 Bronchitis, not specified as acute or chronic: Secondary | ICD-10-CM | POA: Diagnosis not present

## 2018-06-23 DIAGNOSIS — J40 Bronchitis, not specified as acute or chronic: Secondary | ICD-10-CM | POA: Diagnosis not present

## 2018-06-23 DIAGNOSIS — J329 Chronic sinusitis, unspecified: Secondary | ICD-10-CM | POA: Diagnosis not present

## 2018-07-26 ENCOUNTER — Ambulatory Visit: Payer: Medicare HMO | Admitting: Neurology

## 2018-08-14 ENCOUNTER — Telehealth: Payer: Self-pay | Admitting: Neurology

## 2018-08-14 MED ORDER — CARBIDOPA-LEVODOPA 25-100 MG PO TABS: 1.0000 | ORAL_TABLET | Freq: Four times a day (QID) | ORAL | 3 refills | Status: DC

## 2018-08-14 NOTE — Telephone Encounter (Signed)
Pt states tremors having been gradually getting worse over thepast 6 weeks. She said it is more of a shaking than tremors. She is wanting to increase carbidopa-levodopa (SINEMET IR) 25-100 MG tablet. Pharmacy: [facility]

## 2018-08-14 NOTE — Telephone Encounter (Signed)
Please verify the patient is taking generic Sinemet 1 pill 3 times a day at this time. She may increase it to 1 pill 4 times a day, approximately every 4 hours. Pls offer follow-up appointment in June, has none pending. rx sent to mail order pharm.

## 2018-08-14 NOTE — Telephone Encounter (Signed)
I called pt. She confirmed that she is taking sinemet 1 pill TID. I advised her that she may increase it to 1 pill QID, about every 4 hours. Pt is agreeable to a follow up. Needs an early PM appt. Appt made for 11/08/2018 at 1:00pm. Pt verbalized understanding of new appt date and time and of medication instructions.

## 2018-08-28 NOTE — Telephone Encounter (Signed)
I called pt. She reports that she is only taking the sinemet TID. She believes that her tremors increased due to stress 2 weeks ago. Her tremors are currently well controlled taking TID dosing. Pt just wanted to give our office an update.

## 2018-08-28 NOTE — Telephone Encounter (Signed)
Pt called in and stated her script for Sinement needs to be updated she states she doesn't take 4 she only is taking 3 tabs

## 2018-08-31 NOTE — Telephone Encounter (Signed)
I called pt. She reports that 2 days ago, she put a heating pad on her back and neck. The next morning she woke up and her tremors were basically all gone. She reports a residual mild tremor but she wants to stop taking the sinemet. She reports that she only took 1/2 tablet TID yesterday and will do the same today. She will keep doing this until she hears otherwise from Dr. Rexene Alberts. I advised her that there is a risk of her tremors returning if she stops the sinemet. Pt is aware of this and would still prefer to try stopping sinemet. I advised her that Dr. Rexene Alberts returns on Monday. Pt is agreeable to waiting until Dr. Rexene Alberts returns for her advice on further tapering the sinemet.

## 2018-08-31 NOTE — Telephone Encounter (Signed)
Pt called in and wanting to know the best way to taper herself off of her sinement .

## 2018-09-04 NOTE — Telephone Encounter (Signed)
I called pt again to discuss. No answer, left a message asking her to call me back. 

## 2018-09-04 NOTE — Telephone Encounter (Signed)
If patient wants to taper off the C/L, it's okay to try going without. She always has the option to restart at a later point, if she wants to retry it. Pls call her back to let her know.

## 2018-09-04 NOTE — Telephone Encounter (Signed)
I spoke with Dr. Rexene Alberts. Pt can stop the C/L today, if she has been taking 1/2 tablet TID, or she can start taking 1/2 tablet BID for a few days and then stop. I reminded pt of her appt on 11/08/18. Pt verbalized understanding of recommendations. She will call us if she has questions or concerns before then.

## 2018-09-04 NOTE — Telephone Encounter (Signed)
Advised pt of information below, also advised her to return call if any more questions

## 2018-09-13 DIAGNOSIS — Z6825 Body mass index (BMI) 25.0-25.9, adult: Secondary | ICD-10-CM | POA: Diagnosis not present

## 2018-09-13 DIAGNOSIS — Z0000 Encounter for general adult medical examination without abnormal findings: Secondary | ICD-10-CM | POA: Diagnosis not present

## 2018-09-13 DIAGNOSIS — Z79899 Other long term (current) drug therapy: Secondary | ICD-10-CM | POA: Diagnosis not present

## 2018-09-13 DIAGNOSIS — R002 Palpitations: Secondary | ICD-10-CM | POA: Diagnosis not present

## 2018-10-30 DIAGNOSIS — K1230 Oral mucositis (ulcerative), unspecified: Secondary | ICD-10-CM | POA: Diagnosis not present

## 2018-10-30 DIAGNOSIS — Z6825 Body mass index (BMI) 25.0-25.9, adult: Secondary | ICD-10-CM | POA: Diagnosis not present

## 2018-10-30 DIAGNOSIS — G4700 Insomnia, unspecified: Secondary | ICD-10-CM | POA: Diagnosis not present

## 2018-10-30 DIAGNOSIS — J00 Acute nasopharyngitis [common cold]: Secondary | ICD-10-CM | POA: Diagnosis not present

## 2018-10-30 DIAGNOSIS — K121 Other forms of stomatitis: Secondary | ICD-10-CM | POA: Diagnosis not present

## 2018-11-08 ENCOUNTER — Ambulatory Visit: Payer: Self-pay | Admitting: Neurology

## 2018-12-13 DIAGNOSIS — R251 Tremor, unspecified: Secondary | ICD-10-CM | POA: Diagnosis not present

## 2018-12-13 DIAGNOSIS — I951 Orthostatic hypotension: Secondary | ICD-10-CM | POA: Diagnosis not present

## 2018-12-13 DIAGNOSIS — G20 Parkinson's disease: Secondary | ICD-10-CM | POA: Diagnosis not present

## 2018-12-22 DIAGNOSIS — Z20828 Contact with and (suspected) exposure to other viral communicable diseases: Secondary | ICD-10-CM | POA: Diagnosis not present

## 2019-01-08 ENCOUNTER — Telehealth: Payer: Self-pay | Admitting: Neurology

## 2019-01-08 MED ORDER — CARBIDOPA-LEVODOPA 25-100 MG PO TABS: 1.0000 | ORAL_TABLET | Freq: Four times a day (QID) | ORAL | 3 refills | Status: AC

## 2019-01-08 NOTE — Telephone Encounter (Signed)
Pt states she accidentally cancelled her order for carbidopa-levodopa (SINEMET IR) 25-100 MG tablet.  Pt states [employer] told her that Dr Rexene Alberts will have to call in the prescription to them again.

## 2019-01-08 NOTE — Telephone Encounter (Signed)
Sinemet order sent to [facility].

## 2019-01-08 NOTE — Addendum Note (Signed)
Addended by: Lester Cabell A on: 01/08/2019 09:40 AM   Modules accepted: Orders

## 2019-02-03 DIAGNOSIS — Z23 Encounter for immunization: Secondary | ICD-10-CM | POA: Diagnosis not present

## 2019-02-05 DIAGNOSIS — Z6825 Body mass index (BMI) 25.0-25.9, adult: Secondary | ICD-10-CM | POA: Diagnosis not present

## 2019-02-05 DIAGNOSIS — Z01419 Encounter for gynecological examination (general) (routine) without abnormal findings: Secondary | ICD-10-CM | POA: Diagnosis not present

## 2019-02-05 DIAGNOSIS — Z1231 Encounter for screening mammogram for malignant neoplasm of breast: Secondary | ICD-10-CM | POA: Diagnosis not present

## 2019-02-05 DIAGNOSIS — F5104 Psychophysiologic insomnia: Secondary | ICD-10-CM | POA: Diagnosis not present

## 2019-05-01 ENCOUNTER — Encounter: Payer: Self-pay | Admitting: Gastroenterology

## 2019-07-11 DIAGNOSIS — J40 Bronchitis, not specified as acute or chronic: Secondary | ICD-10-CM | POA: Diagnosis not present

## 2019-07-11 DIAGNOSIS — J329 Chronic sinusitis, unspecified: Secondary | ICD-10-CM | POA: Diagnosis not present

## 2019-07-11 DIAGNOSIS — B370 Candidal stomatitis: Secondary | ICD-10-CM | POA: Diagnosis not present

## 2019-09-07 ENCOUNTER — Telehealth: Payer: Self-pay | Admitting: Neurology

## 2019-09-07 NOTE — Telephone Encounter (Signed)
Phone rep checked office voicemail's, at 4:18 on 09-06-19 pt left message asking if she can increase her carbidopa-levodopa (SINEMET IR) 25-100 MG tablet from 4 tablets to 5 tablets a day, please call.

## 2019-09-10 NOTE — Telephone Encounter (Signed)
I have reached out to the pt and advised since her last appointment was in 2019 we would need to see her for an appointment before changing her medication. Pt reports is would be very difficult to make it to our office for an appointment.  Pt requested a virtual appointment, I advised I would send her a my chart activation code and she could set up my chart and we could do the virtual visit. Pt's code was sent and was provided with the My chart help desk # incase she has any questions about setting her account up.  Pt verbalized understanding and appreciation for the call.

## 2019-09-13 DIAGNOSIS — Z0000 Encounter for general adult medical examination without abnormal findings: Secondary | ICD-10-CM | POA: Diagnosis not present

## 2019-09-13 DIAGNOSIS — Z6827 Body mass index (BMI) 27.0-27.9, adult: Secondary | ICD-10-CM | POA: Diagnosis not present

## 2019-09-13 DIAGNOSIS — Z79899 Other long term (current) drug therapy: Secondary | ICD-10-CM | POA: Diagnosis not present

## 2019-09-13 DIAGNOSIS — Z5181 Encounter for therapeutic drug level monitoring: Secondary | ICD-10-CM | POA: Diagnosis not present

## 2019-09-13 DIAGNOSIS — G4700 Insomnia, unspecified: Secondary | ICD-10-CM | POA: Diagnosis not present

## 2019-09-13 DIAGNOSIS — F410 Panic disorder [episodic paroxysmal anxiety] without agoraphobia: Secondary | ICD-10-CM | POA: Diagnosis not present

## 2019-09-13 DIAGNOSIS — Z1331 Encounter for screening for depression: Secondary | ICD-10-CM | POA: Diagnosis not present

## 2019-10-10 DIAGNOSIS — R457 State of emotional shock and stress, unspecified: Secondary | ICD-10-CM | POA: Diagnosis not present

## 2019-10-10 DIAGNOSIS — Z041 Encounter for examination and observation following transport accident: Secondary | ICD-10-CM | POA: Diagnosis not present

## 2019-10-10 DIAGNOSIS — Z5321 Procedure and treatment not carried out due to patient leaving prior to being seen by health care provider: Secondary | ICD-10-CM | POA: Diagnosis not present

## 2019-11-05 ENCOUNTER — Telehealth: Payer: Self-pay

## 2019-11-05 NOTE — Telephone Encounter (Signed)
error 

## 2019-11-13 DIAGNOSIS — Z6825 Body mass index (BMI) 25.0-25.9, adult: Secondary | ICD-10-CM | POA: Diagnosis not present

## 2019-11-13 DIAGNOSIS — F410 Panic disorder [episodic paroxysmal anxiety] without agoraphobia: Secondary | ICD-10-CM | POA: Diagnosis not present

## 2019-11-19 ENCOUNTER — Ambulatory Visit: Payer: Medicare HMO | Admitting: Podiatry

## 2019-12-03 DIAGNOSIS — R1013 Epigastric pain: Secondary | ICD-10-CM | POA: Diagnosis not present

## 2019-12-03 DIAGNOSIS — K219 Gastro-esophageal reflux disease without esophagitis: Secondary | ICD-10-CM | POA: Diagnosis not present

## 2019-12-03 DIAGNOSIS — K2270 Barrett's esophagus without dysplasia: Secondary | ICD-10-CM | POA: Diagnosis not present

## 2019-12-25 DIAGNOSIS — Z1159 Encounter for screening for other viral diseases: Secondary | ICD-10-CM | POA: Diagnosis not present

## 2020-01-01 DIAGNOSIS — K5730 Diverticulosis of large intestine without perforation or abscess without bleeding: Secondary | ICD-10-CM | POA: Diagnosis not present

## 2020-01-01 DIAGNOSIS — G20 Parkinson's disease: Secondary | ICD-10-CM | POA: Diagnosis not present

## 2020-01-01 DIAGNOSIS — K2270 Barrett's esophagus without dysplasia: Secondary | ICD-10-CM | POA: Diagnosis not present

## 2020-01-01 DIAGNOSIS — Z86010 Personal history of colonic polyps: Secondary | ICD-10-CM | POA: Diagnosis not present

## 2020-01-01 DIAGNOSIS — K228 Other specified diseases of esophagus: Secondary | ICD-10-CM | POA: Diagnosis not present

## 2020-01-01 DIAGNOSIS — D122 Benign neoplasm of ascending colon: Secondary | ICD-10-CM | POA: Diagnosis not present

## 2020-01-01 DIAGNOSIS — K229 Disease of esophagus, unspecified: Secondary | ICD-10-CM | POA: Diagnosis not present

## 2020-01-01 DIAGNOSIS — K449 Diaphragmatic hernia without obstruction or gangrene: Secondary | ICD-10-CM | POA: Diagnosis not present

## 2020-01-01 DIAGNOSIS — K648 Other hemorrhoids: Secondary | ICD-10-CM | POA: Diagnosis not present

## 2020-01-01 DIAGNOSIS — K644 Residual hemorrhoidal skin tags: Secondary | ICD-10-CM | POA: Diagnosis not present

## 2020-01-01 DIAGNOSIS — K219 Gastro-esophageal reflux disease without esophagitis: Secondary | ICD-10-CM | POA: Diagnosis not present

## 2020-01-01 DIAGNOSIS — K208 Other esophagitis without bleeding: Secondary | ICD-10-CM | POA: Diagnosis not present

## 2020-01-01 DIAGNOSIS — K635 Polyp of colon: Secondary | ICD-10-CM | POA: Diagnosis not present

## 2020-01-03 ENCOUNTER — Telehealth: Payer: Self-pay | Admitting: *Deleted

## 2020-01-03 ENCOUNTER — Other Ambulatory Visit: Payer: Self-pay | Admitting: Neurology

## 2020-01-03 NOTE — Telephone Encounter (Signed)
Our office received a refill request for Sinemet. The patient was last seen 03/27/2018. I called her and was informed she is now under the care of Dr. Linus Mako at [facility].

## 2020-01-24 DIAGNOSIS — Z23 Encounter for immunization: Secondary | ICD-10-CM | POA: Diagnosis not present

## 2020-01-24 DIAGNOSIS — Z6825 Body mass index (BMI) 25.0-25.9, adult: Secondary | ICD-10-CM | POA: Diagnosis not present

## 2020-01-24 DIAGNOSIS — N898 Other specified noninflammatory disorders of vagina: Secondary | ICD-10-CM | POA: Diagnosis not present

## 2020-02-15 DIAGNOSIS — Z1231 Encounter for screening mammogram for malignant neoplasm of breast: Secondary | ICD-10-CM | POA: Diagnosis not present

## 2020-02-15 DIAGNOSIS — R928 Other abnormal and inconclusive findings on diagnostic imaging of breast: Secondary | ICD-10-CM | POA: Diagnosis not present

## 2020-02-22 DIAGNOSIS — Z79899 Other long term (current) drug therapy: Secondary | ICD-10-CM | POA: Diagnosis not present

## 2020-02-25 DIAGNOSIS — Z23 Encounter for immunization: Secondary | ICD-10-CM | POA: Diagnosis not present

## 2020-03-04 DIAGNOSIS — N993 Prolapse of vaginal vault after hysterectomy: Secondary | ICD-10-CM | POA: Diagnosis not present

## 2020-03-04 DIAGNOSIS — Z01419 Encounter for gynecological examination (general) (routine) without abnormal findings: Secondary | ICD-10-CM | POA: Diagnosis not present

## 2020-03-04 DIAGNOSIS — Z90710 Acquired absence of both cervix and uterus: Secondary | ICD-10-CM | POA: Diagnosis not present

## 2020-03-04 DIAGNOSIS — N952 Postmenopausal atrophic vaginitis: Secondary | ICD-10-CM | POA: Diagnosis not present

## 2020-03-05 DIAGNOSIS — I951 Orthostatic hypotension: Secondary | ICD-10-CM | POA: Diagnosis not present

## 2020-03-05 DIAGNOSIS — G20 Parkinson's disease: Secondary | ICD-10-CM | POA: Diagnosis not present

## 2020-03-05 DIAGNOSIS — Z79899 Other long term (current) drug therapy: Secondary | ICD-10-CM | POA: Diagnosis not present

## 2020-03-08 DIAGNOSIS — K219 Gastro-esophageal reflux disease without esophagitis: Secondary | ICD-10-CM | POA: Diagnosis not present

## 2020-03-08 DIAGNOSIS — F410 Panic disorder [episodic paroxysmal anxiety] without agoraphobia: Secondary | ICD-10-CM | POA: Diagnosis not present

## 2020-03-19 DIAGNOSIS — F410 Panic disorder [episodic paroxysmal anxiety] without agoraphobia: Secondary | ICD-10-CM | POA: Diagnosis not present

## 2020-03-19 DIAGNOSIS — Z6824 Body mass index (BMI) 24.0-24.9, adult: Secondary | ICD-10-CM | POA: Diagnosis not present

## 2020-03-27 DIAGNOSIS — M1990 Unspecified osteoarthritis, unspecified site: Secondary | ICD-10-CM | POA: Diagnosis not present

## 2020-03-27 DIAGNOSIS — Z6825 Body mass index (BMI) 25.0-25.9, adult: Secondary | ICD-10-CM | POA: Diagnosis not present

## 2020-03-31 DIAGNOSIS — M5416 Radiculopathy, lumbar region: Secondary | ICD-10-CM | POA: Diagnosis not present

## 2020-03-31 DIAGNOSIS — Z6825 Body mass index (BMI) 25.0-25.9, adult: Secondary | ICD-10-CM | POA: Diagnosis not present

## 2020-04-08 DIAGNOSIS — K219 Gastro-esophageal reflux disease without esophagitis: Secondary | ICD-10-CM | POA: Diagnosis not present

## 2020-04-08 DIAGNOSIS — F410 Panic disorder [episodic paroxysmal anxiety] without agoraphobia: Secondary | ICD-10-CM | POA: Diagnosis not present

## 2020-04-16 DIAGNOSIS — M545 Low back pain, unspecified: Secondary | ICD-10-CM | POA: Diagnosis not present

## 2020-04-16 DIAGNOSIS — M546 Pain in thoracic spine: Secondary | ICD-10-CM | POA: Diagnosis not present

## 2020-04-18 DIAGNOSIS — M810 Age-related osteoporosis without current pathological fracture: Secondary | ICD-10-CM | POA: Diagnosis not present

## 2020-05-01 DIAGNOSIS — M546 Pain in thoracic spine: Secondary | ICD-10-CM | POA: Diagnosis not present

## 2020-05-01 DIAGNOSIS — M545 Low back pain, unspecified: Secondary | ICD-10-CM | POA: Diagnosis not present

## 2020-07-02 DIAGNOSIS — N6489 Other specified disorders of breast: Secondary | ICD-10-CM | POA: Diagnosis not present

## 2020-07-02 DIAGNOSIS — R928 Other abnormal and inconclusive findings on diagnostic imaging of breast: Secondary | ICD-10-CM | POA: Diagnosis not present

## 2020-07-11 DIAGNOSIS — L814 Other melanin hyperpigmentation: Secondary | ICD-10-CM | POA: Diagnosis not present

## 2020-07-11 DIAGNOSIS — Z6825 Body mass index (BMI) 25.0-25.9, adult: Secondary | ICD-10-CM | POA: Diagnosis not present

## 2020-08-11 DIAGNOSIS — Z124 Encounter for screening for malignant neoplasm of cervix: Secondary | ICD-10-CM | POA: Diagnosis not present

## 2020-08-11 DIAGNOSIS — Z90710 Acquired absence of both cervix and uterus: Secondary | ICD-10-CM | POA: Diagnosis not present

## 2020-08-11 DIAGNOSIS — Z1272 Encounter for screening for malignant neoplasm of vagina: Secondary | ICD-10-CM | POA: Diagnosis not present

## 2020-08-11 DIAGNOSIS — Z6825 Body mass index (BMI) 25.0-25.9, adult: Secondary | ICD-10-CM | POA: Diagnosis not present

## 2020-08-26 DIAGNOSIS — R42 Dizziness and giddiness: Secondary | ICD-10-CM | POA: Diagnosis not present

## 2020-08-26 DIAGNOSIS — Z6825 Body mass index (BMI) 25.0-25.9, adult: Secondary | ICD-10-CM | POA: Diagnosis not present

## 2020-09-24 DIAGNOSIS — J3489 Other specified disorders of nose and nasal sinuses: Secondary | ICD-10-CM | POA: Diagnosis not present

## 2020-09-24 DIAGNOSIS — B3781 Candidal esophagitis: Secondary | ICD-10-CM | POA: Diagnosis not present

## 2020-09-24 DIAGNOSIS — B370 Candidal stomatitis: Secondary | ICD-10-CM | POA: Diagnosis not present

## 2020-11-06 DIAGNOSIS — K219 Gastro-esophageal reflux disease without esophagitis: Secondary | ICD-10-CM | POA: Diagnosis not present

## 2020-11-06 DIAGNOSIS — F410 Panic disorder [episodic paroxysmal anxiety] without agoraphobia: Secondary | ICD-10-CM | POA: Diagnosis not present

## 2020-11-11 DIAGNOSIS — Z6825 Body mass index (BMI) 25.0-25.9, adult: Secondary | ICD-10-CM | POA: Diagnosis not present

## 2020-11-11 DIAGNOSIS — R300 Dysuria: Secondary | ICD-10-CM | POA: Diagnosis not present

## 2020-11-11 DIAGNOSIS — Z6824 Body mass index (BMI) 24.0-24.9, adult: Secondary | ICD-10-CM | POA: Diagnosis not present

## 2020-12-15 ENCOUNTER — Telehealth: Payer: Self-pay | Admitting: Neurology

## 2020-12-15 NOTE — Telephone Encounter (Signed)
Pt states she is having a hard time with [employer] refilling the prescription. They are wanting clear directions on how many tablets pt should take. Pt requesting a call back.

## 2020-12-15 NOTE — Telephone Encounter (Signed)
I see here that patient has been under the care of Dr. Deboraha Sprang and we have not seen her since 2019.  I called the patient asked her to please call Dr. Liane Comber office to obtain advice on her dosage. She verbalized understanding. She called this morning but is waiting to hear back.

## 2021-01-19 DIAGNOSIS — Z79899 Other long term (current) drug therapy: Secondary | ICD-10-CM | POA: Diagnosis not present

## 2021-01-19 DIAGNOSIS — Z6823 Body mass index (BMI) 23.0-23.9, adult: Secondary | ICD-10-CM | POA: Diagnosis not present

## 2021-01-19 DIAGNOSIS — M810 Age-related osteoporosis without current pathological fracture: Secondary | ICD-10-CM | POA: Diagnosis not present

## 2021-01-19 DIAGNOSIS — E782 Mixed hyperlipidemia: Secondary | ICD-10-CM | POA: Diagnosis not present

## 2021-01-19 DIAGNOSIS — Z0000 Encounter for general adult medical examination without abnormal findings: Secondary | ICD-10-CM | POA: Diagnosis not present

## 2021-01-19 DIAGNOSIS — Z1331 Encounter for screening for depression: Secondary | ICD-10-CM | POA: Diagnosis not present

## 2021-01-19 DIAGNOSIS — M1990 Unspecified osteoarthritis, unspecified site: Secondary | ICD-10-CM | POA: Diagnosis not present

## 2021-01-21 DIAGNOSIS — R195 Other fecal abnormalities: Secondary | ICD-10-CM | POA: Diagnosis not present

## 2021-01-21 DIAGNOSIS — R634 Abnormal weight loss: Secondary | ICD-10-CM | POA: Diagnosis not present

## 2021-01-21 DIAGNOSIS — R630 Anorexia: Secondary | ICD-10-CM | POA: Diagnosis not present

## 2021-01-21 DIAGNOSIS — K2270 Barrett's esophagus without dysplasia: Secondary | ICD-10-CM | POA: Diagnosis not present

## 2021-01-21 DIAGNOSIS — K219 Gastro-esophageal reflux disease without esophagitis: Secondary | ICD-10-CM | POA: Diagnosis not present

## 2021-01-21 DIAGNOSIS — R142 Eructation: Secondary | ICD-10-CM | POA: Diagnosis not present

## 2021-01-28 DIAGNOSIS — G20 Parkinson's disease: Secondary | ICD-10-CM | POA: Diagnosis not present

## 2021-03-04 DIAGNOSIS — Z23 Encounter for immunization: Secondary | ICD-10-CM | POA: Diagnosis not present

## 2021-03-16 DIAGNOSIS — Z23 Encounter for immunization: Secondary | ICD-10-CM | POA: Diagnosis not present

## 2021-06-11 DIAGNOSIS — R300 Dysuria: Secondary | ICD-10-CM | POA: Diagnosis not present

## 2021-06-11 DIAGNOSIS — Z6823 Body mass index (BMI) 23.0-23.9, adult: Secondary | ICD-10-CM | POA: Diagnosis not present

## 2021-06-24 DIAGNOSIS — Z6822 Body mass index (BMI) 22.0-22.9, adult: Secondary | ICD-10-CM | POA: Diagnosis not present

## 2021-06-24 DIAGNOSIS — T839XXA Unspecified complication of genitourinary prosthetic device, implant and graft, initial encounter: Secondary | ICD-10-CM | POA: Diagnosis not present

## 2021-08-24 DIAGNOSIS — Z7983 Long term (current) use of bisphosphonates: Secondary | ICD-10-CM | POA: Diagnosis not present

## 2021-08-24 DIAGNOSIS — Z6823 Body mass index (BMI) 23.0-23.9, adult: Secondary | ICD-10-CM | POA: Diagnosis not present

## 2021-08-24 DIAGNOSIS — R29890 Loss of height: Secondary | ICD-10-CM | POA: Diagnosis not present

## 2021-08-24 DIAGNOSIS — M816 Localized osteoporosis [Lequesne]: Secondary | ICD-10-CM | POA: Diagnosis not present

## 2021-08-24 DIAGNOSIS — Z1231 Encounter for screening mammogram for malignant neoplasm of breast: Secondary | ICD-10-CM | POA: Diagnosis not present

## 2021-08-24 DIAGNOSIS — N958 Other specified menopausal and perimenopausal disorders: Secondary | ICD-10-CM | POA: Diagnosis not present

## 2021-08-24 DIAGNOSIS — Z01419 Encounter for gynecological examination (general) (routine) without abnormal findings: Secondary | ICD-10-CM | POA: Diagnosis not present

## 2021-08-26 ENCOUNTER — Other Ambulatory Visit: Payer: Self-pay | Admitting: Obstetrics and Gynecology

## 2021-08-26 DIAGNOSIS — R928 Other abnormal and inconclusive findings on diagnostic imaging of breast: Secondary | ICD-10-CM

## 2021-09-11 ENCOUNTER — Other Ambulatory Visit: Payer: Self-pay | Admitting: Obstetrics and Gynecology

## 2021-09-11 ENCOUNTER — Ambulatory Visit
Admission: RE | Admit: 2021-09-11 | Discharge: 2021-09-11 | Disposition: A | Discharge status: Home / Self Care | Payer: Medicare HMO | Source: Ambulatory Visit | Attending: Obstetrics and Gynecology | Admitting: Obstetrics and Gynecology

## 2021-09-11 DIAGNOSIS — R928 Other abnormal and inconclusive findings on diagnostic imaging of breast: Secondary | ICD-10-CM | POA: Diagnosis not present

## 2021-09-16 ENCOUNTER — Ambulatory Visit
Admission: RE | Admit: 2021-09-16 | Discharge: 2021-09-16 | Disposition: A | Discharge status: Home / Self Care | Payer: Medicare HMO | Source: Ambulatory Visit | Attending: Obstetrics and Gynecology | Admitting: Obstetrics and Gynecology

## 2021-09-16 DIAGNOSIS — R928 Other abnormal and inconclusive findings on diagnostic imaging of breast: Secondary | ICD-10-CM

## 2021-09-16 DIAGNOSIS — C50312 Malignant neoplasm of lower-inner quadrant of left female breast: Secondary | ICD-10-CM | POA: Diagnosis not present

## 2021-09-16 DIAGNOSIS — N6324 Unspecified lump in the left breast, lower inner quadrant: Secondary | ICD-10-CM

## 2021-09-21 ENCOUNTER — Other Ambulatory Visit: Payer: Self-pay | Admitting: General Surgery

## 2021-09-21 DIAGNOSIS — C50312 Malignant neoplasm of lower-inner quadrant of left female breast: Secondary | ICD-10-CM

## 2021-09-22 ENCOUNTER — Other Ambulatory Visit: Payer: Self-pay | Admitting: General Surgery

## 2021-09-22 DIAGNOSIS — C50312 Malignant neoplasm of lower-inner quadrant of left female breast: Secondary | ICD-10-CM

## 2021-09-22 DIAGNOSIS — Z0100 Encounter for examination of eyes and vision without abnormal findings: Secondary | ICD-10-CM | POA: Diagnosis not present

## 2021-09-22 DIAGNOSIS — H524 Presbyopia: Secondary | ICD-10-CM | POA: Diagnosis not present

## 2021-09-23 ENCOUNTER — Encounter (HOSPITAL_COMMUNITY): Payer: Self-pay | Admitting: General Surgery

## 2021-09-23 ENCOUNTER — Other Ambulatory Visit: Payer: Self-pay

## 2021-09-23 NOTE — Pre-Procedure Instructions (Signed)
SDW CALL ? ?Patient was given pre-op instructions over the phone. The opportunity was given for the patient to ask questions. No further questions asked. Patient verbalized understanding of instructions given. ? ? ?PCP - Ronita Hipps, MD ?Cardiologist - denies- saw Dr. Jimmie Molly in 2017 d/t shortness of breath with exertion- pt denied having chest pain or palpitations at the time ?Neurologist- Dr Linus Mako ? ?PPM/ICD - denies ?Chest x-ray - N/A ?EKG -N/A ?Stress Echo Test - 2017 in care everywhere- D/t having issues going up hill after getting mail- shortness of breathe with exertion- everything was normal per patient.  ?Cardiac Cath - denies ? ?Sleep Study - denies ? ?Blood Thinner Instructions: no blood thinners or ASA per patient ? ?ERAS Protcol - ERAS order per surgeon ? ?COVID TEST- N/A ? ?Anesthesia review: follow up on requested record- STRESS ECHO- patient reports normal result and no cardiac follow up since. Patient for seed placement on 09/24/21 14:30 PM.  ? ?Patient denies shortness of breath, fever, cough and chest pain over the phone call ? ? ? ?Surgical Instructions ? ? ? Your procedure is scheduled on Friday, May 19 ? Report to [facility] Main Entrance "A" at 8:00 A.M., then check in with the Admitting office. ? Call this number if you have problems the morning of surgery: ? [phone] ? ? ? Remember: ? Do not eat after midnight the night before your surgery ? ?You may drink clear liquids until 7:30AM the morning of your surgery.   ?Clear liquids allowed are: Water, Non-Citrus Juices (without pulp), Carbonated Beverages, Clear Tea, Black Coffee ONLY (NO MILK, CREAM OR POWDERED CREAMER of any kind), and Gatorade ? ? Take these medicines the morning of surgery with A SIP OF WATER:  ? ?carbidopa-levodopa (SINEMET IR)  ?pantoprazole (PROTONIX)  ? ? ?As of today, STOP taking any Aspirin (unless otherwise instructed by your surgeon) Aleve, Naproxen, Ibuprofen, Motrin, Advil, Goody's, BC's, all herbal  medications, fish oil, and all vitamins. ? ?[facility] is not responsible for any belongings or valuables.   ?  ?Contacts, glasses, hearing aids, dentures or partials may not be worn into surgery, please bring cases for these belongings ?  ?Patients discharged the day of surgery will not be allowed to drive home, and someone needs to stay with them for 24 hours. ? ? ?SURGICAL WAITING ROOM VISITATION ?No visitors are allowed in pre-op area with patient.  ?Patients having surgery or a procedure in a hospital may have two support people in the waiting room. ?Children under the age of 79 must have an adult with them who is not the patient. ?They may stay in the waiting area during the procedure and may switch out with other visitors. If the patient needs to stay at the hospital during part of their recovery, the visitor guidelines for inpatient rooms apply. ? ?Please refer to the [facility] website for the visitor guidelines for Inpatients (after your surgery is over and you are in a regular room).  ? ? ? ?Special instructions:   ? ?Oral Hygiene is also important to reduce your risk of infection.  Remember - BRUSH YOUR TEETH THE MORNING OF SURGERY WITH YOUR REGULAR TOOTHPASTE ? ? ?Day of Surgery: ? ?Take a shower the day of or night before with antibacterial soap. ?Wear Clean/Comfortable clothing the morning of surgery ?Do not apply any deodorants/lotions.   ?Do not wear jewelry or makeup ?Do not wear lotions, powders, perfumes/colognes, or deodorant. ?Do not shave 48 hours prior to surgery.  Men may shave face and neck. ?Do not bring valuables to the hospital. ?Do not wear nail polish, gel polish, artificial nails, or any other type of covering on natural nails (fingers and toes) ?If you have artificial nails or gel coating that need to be removed by a nail salon, please have this removed prior to surgery. Artificial nails or gel coating may interfere with anesthesia's ability to adequately monitor your vital  signs. ?Remember to brush your teeth WITH YOUR REGULAR TOOTHPASTE. ? ? ? ? ? ?

## 2021-09-24 ENCOUNTER — Telehealth: Payer: Self-pay | Admitting: Radiation Oncology

## 2021-09-24 ENCOUNTER — Ambulatory Visit
Admission: RE | Admit: 2021-09-24 | Discharge: 2021-09-24 | Disposition: A | Discharge status: Home / Self Care | Payer: Medicare HMO | Source: Ambulatory Visit | Attending: General Surgery | Admitting: General Surgery

## 2021-09-24 ENCOUNTER — Telehealth: Payer: Self-pay | Admitting: Hematology and Oncology

## 2021-09-24 ENCOUNTER — Encounter (HOSPITAL_BASED_OUTPATIENT_CLINIC_OR_DEPARTMENT_OTHER)
Admission: RE | Admit: 2021-09-24 | Discharge: 2021-09-24 | Disposition: A | Discharge status: Home / Self Care | Payer: Medicare HMO | Source: Ambulatory Visit | Attending: General Surgery | Admitting: General Surgery

## 2021-09-24 ENCOUNTER — Other Ambulatory Visit: Payer: Self-pay | Admitting: *Deleted

## 2021-09-24 DIAGNOSIS — Z01818 Encounter for other preprocedural examination: Secondary | ICD-10-CM | POA: Insufficient documentation

## 2021-09-24 DIAGNOSIS — R928 Other abnormal and inconclusive findings on diagnostic imaging of breast: Secondary | ICD-10-CM | POA: Diagnosis not present

## 2021-09-24 DIAGNOSIS — Z800 Family history of malignant neoplasm of digestive organs: Secondary | ICD-10-CM | POA: Diagnosis not present

## 2021-09-24 DIAGNOSIS — Z803 Family history of malignant neoplasm of breast: Secondary | ICD-10-CM | POA: Diagnosis not present

## 2021-09-24 DIAGNOSIS — Z90710 Acquired absence of both cervix and uterus: Secondary | ICD-10-CM | POA: Diagnosis not present

## 2021-09-24 DIAGNOSIS — C50312 Malignant neoplasm of lower-inner quadrant of left female breast: Secondary | ICD-10-CM

## 2021-09-24 DIAGNOSIS — Z170 Estrogen receptor positive status [ER+]: Secondary | ICD-10-CM | POA: Insufficient documentation

## 2021-09-24 LAB — CBC WITH DIFFERENTIAL/PLATELET
Abs Immature Granulocytes: 0.02 10*3/uL (ref 0.00–0.07)
Basophils Absolute: 0 10*3/uL (ref 0.0–0.1)
Basophils Relative: 1 %
Eosinophils Absolute: 0.1 10*3/uL (ref 0.0–0.5)
Eosinophils Relative: 1 %
HCT: 36.6 % (ref 36.0–46.0)
Hemoglobin: 12.3 g/dL (ref 12.0–15.0)
Immature Granulocytes: 0 %
Lymphocytes Relative: 37 %
Lymphs Abs: 1.8 10*3/uL (ref 0.7–4.0)
MCH: 34.7 pg — ABNORMAL HIGH (ref 26.0–34.0)
MCHC: 33.6 g/dL (ref 30.0–36.0)
MCV: 103.4 fL — ABNORMAL HIGH (ref 80.0–100.0)
Monocytes Absolute: 0.5 10*3/uL (ref 0.1–1.0)
Monocytes Relative: 10 %
Neutro Abs: 2.4 10*3/uL (ref 1.7–7.7)
Neutrophils Relative %: 51 %
Platelets: 253 10*3/uL (ref 150–400)
RBC: 3.54 MIL/uL — ABNORMAL LOW (ref 3.87–5.11)
RDW: 12.9 % (ref 11.5–15.5)
WBC: 4.8 10*3/uL (ref 4.0–10.5)
nRBC: 0 % (ref 0.0–0.2)

## 2021-09-24 LAB — COMPREHENSIVE METABOLIC PANEL
ALT: <5 U/L (ref 0–44)
AST: 15 U/L (ref 15–41)
Albumin: 3.8 g/dL (ref 3.5–5.0)
Alkaline Phosphatase: 58 U/L (ref 38–126)
Anion gap: 8 (ref 5–15)
BUN: 12 mg/dL (ref 8–23)
CO2: 30 mmol/L (ref 22–32)
Calcium: 9.2 mg/dL (ref 8.9–10.3)
Chloride: 103 mmol/L (ref 98–111)
Creatinine, Ser: 0.74 mg/dL (ref 0.44–1.00)
GFR, Estimated: >60 mL/min (ref >60)
Glucose, Bld: 111 mg/dL — ABNORMAL HIGH (ref 70–99)
Potassium: 3.4 mmol/L — ABNORMAL LOW (ref 3.5–5.1)
Sodium: 141 mmol/L (ref 135–145)
Total Bilirubin: 0.4 mg/dL (ref 0.3–1.2)
Total Protein: 6.5 g/dL (ref 6.5–8.1)

## 2021-09-24 MED ORDER — ENSURE PRE-SURGERY PO LIQD
296.0000 mL | Freq: Once | ORAL | Status: DC
Start: 2021-09-25 — End: 2021-09-25

## 2021-09-24 NOTE — Telephone Encounter (Signed)
Scheduled appt per 5/18 referral. Pt is aware of appt date and time. Pt is aware to arrive 15 mins prior to appt time and to bring and updated insurance card. Pt is aware of appt location.

## 2021-09-24 NOTE — Anesthesia Preprocedure Evaluation (Addendum)
Anesthesia Evaluation  Patient identified by MRN, date of birth, ID band Patient awake    Reviewed: Allergy & Precautions, NPO status , Patient's Chart, lab work & pertinent test results  History of Anesthesia Complications Negative for: history of anesthetic complications  Airway Mallampati: II  TM Distance: >3 FB Neck ROM: Full    Dental  (+) Missing,    Pulmonary neg pulmonary ROS,    Pulmonary exam normal        Cardiovascular negative cardio ROS Normal cardiovascular exam     Neuro/Psych Parkinson's dx negative psych ROS   GI/Hepatic Neg liver ROS, GERD  Medicated and Controlled,  Endo/Other  negative endocrine ROS  Renal/GU negative Renal ROS  negative genitourinary   Musculoskeletal negative musculoskeletal ROS (+)   Abdominal   Peds  Hematology negative hematology ROS (+)   Anesthesia Other Findings Left breast ca  Reproductive/Obstetrics negative OB ROS                            Anesthesia Physical Anesthesia Plan  ASA: 2  Anesthesia Plan: General   Post-op Pain Management: Tylenol PO (pre-op)*   Induction: Intravenous  PONV Risk Score and Plan: 3 and Treatment may vary due to age or medical condition, Dexamethasone and Ondansetron  Airway Management Planned: LMA  Additional Equipment: None  Intra-op Plan:   Post-operative Plan: Extubation in OR  Informed Consent: I have reviewed the patients History and Physical, chart, labs and discussed the procedure including the risks, benefits and alternatives for the proposed anesthesia with the patient or authorized representative who has indicated his/her understanding and acceptance.     Dental advisory given  Plan Discussed with: CRNA  Anesthesia Plan Comments: (Exercise stress echo 05/05/2016 (copy on chart): Conclusions: Technically adequate study. Normal exercise capacity for age: 57 METS. Borderline  concentric left ventricular hypertrophy with normal biventricular systolic function, EF 16%. No clinical or echocardiographic ischemia at target heart rate. Negative treadmill stress echocardiogram.)      Anesthesia Quick Evaluation

## 2021-09-24 NOTE — Telephone Encounter (Signed)
5/18 @ 9:55 am called pt to be schedule for consult, no answer/could not leave voicemail, mailbox is full.

## 2021-09-24 NOTE — Telephone Encounter (Signed)
5/18 @ 3:29 pm f/u called to get patient schedule for consult, still no answer/voicemail full.

## 2021-09-24 NOTE — Progress Notes (Signed)

## 2021-09-25 ENCOUNTER — Other Ambulatory Visit: Payer: Self-pay

## 2021-09-25 ENCOUNTER — Ambulatory Visit (HOSPITAL_BASED_OUTPATIENT_CLINIC_OR_DEPARTMENT_OTHER)
Admission: RE | Admit: 2021-09-25 | Discharge: 2021-09-25 | Disposition: A | Discharge status: Home / Self Care | Payer: Medicare HMO | Attending: General Surgery | Admitting: General Surgery

## 2021-09-25 ENCOUNTER — Ambulatory Visit (HOSPITAL_BASED_OUTPATIENT_CLINIC_OR_DEPARTMENT_OTHER): Payer: Medicare HMO | Admitting: Physician Assistant

## 2021-09-25 ENCOUNTER — Encounter (HOSPITAL_BASED_OUTPATIENT_CLINIC_OR_DEPARTMENT_OTHER): Payer: Self-pay | Admitting: General Surgery

## 2021-09-25 ENCOUNTER — Telehealth: Payer: Self-pay | Admitting: Radiation Oncology

## 2021-09-25 ENCOUNTER — Encounter (HOSPITAL_BASED_OUTPATIENT_CLINIC_OR_DEPARTMENT_OTHER)
Admission: RE | Disposition: A | Discharge status: Home / Self Care | Payer: Self-pay | Source: Home / Self Care | Attending: General Surgery

## 2021-09-25 ENCOUNTER — Ambulatory Visit
Admission: RE | Admit: 2021-09-25 | Discharge: 2021-09-25 | Disposition: A | Discharge status: Home / Self Care | Payer: Medicare HMO | Source: Ambulatory Visit | Attending: General Surgery | Admitting: General Surgery

## 2021-09-25 DIAGNOSIS — Z90710 Acquired absence of both cervix and uterus: Secondary | ICD-10-CM | POA: Diagnosis not present

## 2021-09-25 DIAGNOSIS — C50912 Malignant neoplasm of unspecified site of left female breast: Secondary | ICD-10-CM | POA: Diagnosis not present

## 2021-09-25 DIAGNOSIS — Z01818 Encounter for other preprocedural examination: Secondary | ICD-10-CM

## 2021-09-25 DIAGNOSIS — Z803 Family history of malignant neoplasm of breast: Secondary | ICD-10-CM | POA: Diagnosis not present

## 2021-09-25 DIAGNOSIS — C50312 Malignant neoplasm of lower-inner quadrant of left female breast: Secondary | ICD-10-CM | POA: Diagnosis not present

## 2021-09-25 DIAGNOSIS — R928 Other abnormal and inconclusive findings on diagnostic imaging of breast: Secondary | ICD-10-CM | POA: Diagnosis not present

## 2021-09-25 DIAGNOSIS — Z800 Family history of malignant neoplasm of digestive organs: Secondary | ICD-10-CM | POA: Insufficient documentation

## 2021-09-25 DIAGNOSIS — Z170 Estrogen receptor positive status [ER+]: Secondary | ICD-10-CM | POA: Diagnosis not present

## 2021-09-25 HISTORY — PX: BREAST LUMPECTOMY WITH RADIOACTIVE SEED LOCALIZATION: SHX6424

## 2021-09-25 HISTORY — DX: Age-related osteoporosis without current pathological fracture: M81.0

## 2021-09-25 HISTORY — DX: Parkinson's disease: G20

## 2021-09-25 HISTORY — DX: Dyspnea, unspecified: R06.00

## 2021-09-25 HISTORY — DX: Gastro-esophageal reflux disease without esophagitis: K21.9

## 2021-09-25 HISTORY — DX: Scoliosis, unspecified: M41.9

## 2021-09-25 SURGERY — BREAST LUMPECTOMY WITH RADIOACTIVE SEED LOCALIZATION
Anesthesia: General | Site: Breast | Laterality: Left

## 2021-09-25 MED ORDER — DEXAMETHASONE SODIUM PHOSPHATE 10 MG/ML IJ SOLN
INTRAMUSCULAR | Status: AC
  Filled 2021-09-25: qty 1

## 2021-09-25 MED ORDER — OXYCODONE HCL 5 MG/5ML PO SOLN: 5.0000 mg | Freq: Once | ORAL | Status: DC | PRN

## 2021-09-25 MED ORDER — BUPIVACAINE HCL (PF) 0.25 % IJ SOLN
INTRAMUSCULAR | Status: AC
  Filled 2021-09-25: qty 30

## 2021-09-25 MED ORDER — DEXAMETHASONE SODIUM PHOSPHATE 10 MG/ML IJ SOLN
INTRAMUSCULAR | Status: DC | PRN
  Administered 2021-09-25: 10 mg via INTRAVENOUS

## 2021-09-25 MED ORDER — ACETAMINOPHEN 500 MG PO TABS
1000.0000 mg | ORAL_TABLET | ORAL | Status: AC
  Administered 2021-09-25: 1000 mg via ORAL

## 2021-09-25 MED ORDER — CEFAZOLIN SODIUM-DEXTROSE 2-3 GM-%(50ML) IV SOLR
INTRAVENOUS | Status: DC | PRN
  Administered 2021-09-25: 2 g via INTRAVENOUS

## 2021-09-25 MED ORDER — BUPIVACAINE HCL 0.25 % IJ SOLN
INTRAMUSCULAR | Status: DC | PRN
  Administered 2021-09-25: 45 mL

## 2021-09-25 MED ORDER — LACTATED RINGERS IV SOLN: INTRAVENOUS | Status: DC | PRN

## 2021-09-25 MED ORDER — PROPOFOL 10 MG/ML IV BOLUS
INTRAVENOUS | Status: AC
Start: 2021-09-25 — End: ?
  Filled 2021-09-25: qty 20

## 2021-09-25 MED ORDER — OXYCODONE HCL 5 MG PO TABS: 2.5000 mg | ORAL_TABLET | Freq: Four times a day (QID) | ORAL | 0 refills | Status: AC | PRN

## 2021-09-25 MED ORDER — FENTANYL CITRATE (PF) 100 MCG/2ML IJ SOLN
INTRAMUSCULAR | Status: AC
  Filled 2021-09-25: qty 2

## 2021-09-25 MED ORDER — EPHEDRINE SULFATE (PRESSORS) 50 MG/ML IJ SOLN
INTRAMUSCULAR | Status: DC | PRN
  Administered 2021-09-25: 15 mg via INTRAVENOUS
  Administered 2021-09-25: 10 mg via INTRAVENOUS

## 2021-09-25 MED ORDER — ONDANSETRON HCL 4 MG/2ML IJ SOLN
INTRAMUSCULAR | Status: AC
  Filled 2021-09-25: qty 2

## 2021-09-25 MED ORDER — LIDOCAINE HCL (CARDIAC) PF 100 MG/5ML IV SOSY
PREFILLED_SYRINGE | INTRAVENOUS | Status: DC | PRN
Start: 2021-09-25 — End: 2021-09-25
  Administered 2021-09-25: 60 mg via INTRAVENOUS

## 2021-09-25 MED ORDER — CEFAZOLIN SODIUM-DEXTROSE 2-4 GM/100ML-% IV SOLN: 2.0000 g | INTRAVENOUS | Status: DC

## 2021-09-25 MED ORDER — ACETAMINOPHEN 500 MG PO TABS
ORAL_TABLET | ORAL | Status: AC
  Filled 2021-09-25: qty 2

## 2021-09-25 MED ORDER — FENTANYL CITRATE (PF) 100 MCG/2ML IJ SOLN
INTRAMUSCULAR | Status: DC | PRN
  Administered 2021-09-25: 25 ug via INTRAVENOUS
  Administered 2021-09-25: 50 ug via INTRAVENOUS

## 2021-09-25 MED ORDER — PROPOFOL 10 MG/ML IV BOLUS
INTRAVENOUS | Status: DC | PRN
  Administered 2021-09-25: 80 mg via INTRAVENOUS

## 2021-09-25 MED ORDER — OXYCODONE HCL 5 MG PO TABS: 5.0000 mg | ORAL_TABLET | Freq: Once | ORAL | Status: DC | PRN

## 2021-09-25 MED ORDER — LIDOCAINE 2% (20 MG/ML) 5 ML SYRINGE
INTRAMUSCULAR | Status: AC
  Filled 2021-09-25: qty 5

## 2021-09-25 MED ORDER — CHLORHEXIDINE GLUCONATE CLOTH 2 % EX PADS: 6.0000 | MEDICATED_PAD | Freq: Once | CUTANEOUS | Status: DC

## 2021-09-25 MED ORDER — CHLORHEXIDINE GLUCONATE CLOTH 2 % EX PADS
6.0000 | MEDICATED_PAD | Freq: Once | CUTANEOUS | Status: DC
Start: 2021-09-25 — End: 2021-09-25

## 2021-09-25 MED ORDER — ONDANSETRON HCL 4 MG/2ML IJ SOLN
INTRAMUSCULAR | Status: DC | PRN
  Administered 2021-09-25: 4 mg via INTRAVENOUS

## 2021-09-25 MED ORDER — LACTATED RINGERS IV SOLN: INTRAVENOUS | Status: DC

## 2021-09-25 MED ORDER — FENTANYL CITRATE (PF) 100 MCG/2ML IJ SOLN: 25.0000 ug | INTRAMUSCULAR | Status: DC | PRN

## 2021-09-25 MED ORDER — LIDOCAINE-EPINEPHRINE 1 %-1:100000 IJ SOLN
INTRAMUSCULAR | Status: AC
  Filled 2021-09-25: qty 1

## 2021-09-25 MED ORDER — CEFAZOLIN SODIUM-DEXTROSE 2-4 GM/100ML-% IV SOLN
INTRAVENOUS | Status: AC
  Filled 2021-09-25: qty 100

## 2021-09-25 SURGICAL SUPPLY — 52 items
ADH SKN CLS APL DERMABOND .7 (GAUZE/BANDAGES/DRESSINGS) ×1
APL PRP STRL LF DISP 70% ISPRP (MISCELLANEOUS) ×1
BINDER BREAST LRG (GAUZE/BANDAGES/DRESSINGS) IMPLANT
BINDER BREAST MEDIUM (GAUZE/BANDAGES/DRESSINGS) IMPLANT
BINDER BREAST XLRG (GAUZE/BANDAGES/DRESSINGS) IMPLANT
BINDER BREAST XXLRG (GAUZE/BANDAGES/DRESSINGS) IMPLANT
BLADE SURG 10 STRL SS (BLADE) ×2 IMPLANT
BLADE SURG 15 STRL LF DISP TIS (BLADE) IMPLANT
BLADE SURG 15 STRL SS (BLADE)
CANISTER SUC SOCK COL 7IN (MISCELLANEOUS) IMPLANT
CANISTER SUCT 1200ML W/VALVE (MISCELLANEOUS) IMPLANT
CHLORAPREP W/TINT 26 (MISCELLANEOUS) ×2 IMPLANT
CLIP TI LARGE 6 (CLIP) ×2 IMPLANT
COVER BACK TABLE 60X90IN (DRAPES) ×2 IMPLANT
COVER MAYO STAND STRL (DRAPES) ×2 IMPLANT
COVER PROBE W GEL 5X96 (DRAPES) ×2 IMPLANT
DERMABOND ADVANCED (GAUZE/BANDAGES/DRESSINGS) ×1
DERMABOND ADVANCED .7 DNX12 (GAUZE/BANDAGES/DRESSINGS) ×1 IMPLANT
DRAPE LAPAROSCOPIC ABDOMINAL (DRAPES) ×2 IMPLANT
DRAPE UTILITY XL STRL (DRAPES) ×2 IMPLANT
ELECT COATED BLADE 2.86 ST (ELECTRODE) ×2 IMPLANT
ELECT REM PT RETURN 9FT ADLT (ELECTROSURGICAL) ×2
ELECTRODE REM PT RTRN 9FT ADLT (ELECTROSURGICAL) ×1 IMPLANT
GAUZE SPONGE 4X4 12PLY STRL LF (GAUZE/BANDAGES/DRESSINGS) ×2 IMPLANT
GLOVE BIO SURGEON STRL SZ 6 (GLOVE) ×2 IMPLANT
GLOVE BIOGEL PI IND STRL 6.5 (GLOVE) ×1 IMPLANT
GLOVE BIOGEL PI INDICATOR 6.5 (GLOVE) ×1
GOWN STRL REUS W/ TWL LRG LVL3 (GOWN DISPOSABLE) ×1 IMPLANT
GOWN STRL REUS W/TWL 2XL LVL3 (GOWN DISPOSABLE) ×2 IMPLANT
GOWN STRL REUS W/TWL LRG LVL3 (GOWN DISPOSABLE) ×2
KIT MARKER MARGIN INK (KITS) ×2 IMPLANT
LIGHT WAVEGUIDE WIDE FLAT (MISCELLANEOUS) IMPLANT
NDL HYPO 25X1 1.5 SAFETY (NEEDLE) ×1 IMPLANT
NEEDLE HYPO 25X1 1.5 SAFETY (NEEDLE) ×2 IMPLANT
NS IRRIG 1000ML POUR BTL (IV SOLUTION) ×2 IMPLANT
PACK BASIN DAY SURGERY FS (CUSTOM PROCEDURE TRAY) ×2 IMPLANT
PENCIL SMOKE EVACUATOR (MISCELLANEOUS) ×2 IMPLANT
SLEEVE SCD COMPRESS KNEE MED (STOCKING) ×2 IMPLANT
SPIKE FLUID TRANSFER (MISCELLANEOUS) IMPLANT
SPONGE T-LAP 18X18 ~~LOC~~+RFID (SPONGE) ×2 IMPLANT
STRIP CLOSURE SKIN 1/2X4 (GAUZE/BANDAGES/DRESSINGS) ×2 IMPLANT
SUT MNCRL AB 4-0 PS2 18 (SUTURE) ×2 IMPLANT
SUT SILK 2 0 SH (SUTURE) IMPLANT
SUT VIC AB 2-0 SH 27 (SUTURE) ×2
SUT VIC AB 2-0 SH 27XBRD (SUTURE) ×1 IMPLANT
SUT VIC AB 3-0 SH 27 (SUTURE) ×2
SUT VIC AB 3-0 SH 27X BRD (SUTURE) ×1 IMPLANT
SYR CONTROL 10ML LL (SYRINGE) ×2 IMPLANT
TOWEL GREEN STERILE FF (TOWEL DISPOSABLE) ×2 IMPLANT
TRAY FAXITRON CT DISP (TRAY / TRAY PROCEDURE) ×2 IMPLANT
TUBE CONNECTING 20X1/4 (TUBING) IMPLANT
YANKAUER SUCT BULB TIP NO VENT (SUCTIONS) IMPLANT

## 2021-09-25 NOTE — Transfer of Care (Signed)
Immediate Anesthesia Transfer of Care Note  Patient: Mary Simpson  Procedure(s) Performed: LEFT BREAST LUMPECTOMY WITH RADIOACTIVE SEED LOCALIZATION (Left: Breast)  Patient Location: PACU  Anesthesia Type:General  Level of Consciousness: awake, alert  and patient cooperative  Airway & Oxygen Therapy: Patient Spontanous Breathing and Patient connected to face mask oxygen  Post-op Assessment: Report given to RN and Post -op Vital signs reviewed and stable  Post vital signs: Reviewed and stable  Last Vitals:  Vitals Value Taken Time  BP 138/57 09/25/21 1245  Temp 36.5 C 09/25/21 1242  Pulse 92 09/25/21 1250  Resp 18 09/25/21 1250  SpO2 100 % 09/25/21 1250  Vitals shown include unvalidated device data.  Last Pain:  Vitals:   09/25/21 1242  TempSrc:   PainSc: 0-No pain         Complications: No notable events documented.

## 2021-09-25 NOTE — Op Note (Signed)
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 The risks, benefits, complications, treatment options, and expected outcomes were discussed with the patient. The possibilities of bleeding, infection, the need for additional procedures, failure to diagnose a condition, and creating a complication requiring other procedures or operations were discussed with the patient. The patient concurred with the proposed plan, giving informed consent.  The site of surgery properly noted/marked. The patient was taken to Operating Room # 2, identified, and the procedure verified as Left breast seed localized lumpectomy.  The left breast and chest were prepped and draped in standard fashion. A timeout was performed according to the surgical safety checklist.  A transverse medial incision was made near the previously placed radioactive seed.  Dissection was carried down around the point of maximum signal intensity. The cautery was used to perform the dissection.   The specimen was inked with the margin marker paint kit.    Specimen radiography confirmed inclusion of the mammographic lesion, the clip, and the seed.  Additional margins were taken anteriorly, superiorly and inferiorly.  The background signal in the breast was zero.  Hemostasis was achieved with cautery.  The tissue was released from the skin in order to have breast tissue to pull together to fill the defect.  The cavity was marked with clips on each border other than the anterior border.  A seroma was created with local anesthetic and the local was  infiltrated into the surrounding skin.  The wound was irrigated and closed with 3-0 vicryl interrupted deep dermal sutures and 4-0 monocryl running subcuticular suture.      Sterile dressings were applied. At the end of the operation, all sponge, instrument, and needle counts were correct.   Findings: Seed, clip in specimen.  Posterior margin is pectoralis, anterior and inferior margins are skin.     Estimated Blood Loss:  min         Specimens: left breast tissue with seed         Complications:  None; patient tolerated the procedure well.         Disposition: PACU - hemodynamically stable.         Condition: stable

## 2021-09-25 NOTE — H&P (Signed)
REFERRING PHYSICIAN: Grewal  PROVIDER: Georgianne Fick, MD  Care Team: Patient Care Team: Kennith Maes, MD as PCP - General (Family Medicine)   MRN: [id number] DOB: Jan 13, 1946 DATE OF ENCOUNTER: 09/21/2021  Subjective   Chief Complaint: Left Breast Cancer   History of Present Illness: Mary Simpson is a 76 y.o. female who is seen today as an office consultation at the request of Dr. Helane Rima for evaluation of Left Breast Cancer  Patient recalled for screening left breast asymmetry. Diagnostic imaging showed a 3 mm mass in the lower inner quadrant on the left at 7:00. A core needle biopsy was performed which showed a grade 1 invasive ductal carcinoma. Receptors are pending.  Her mother had breast cancer and her sister had colon cancer. The patient gets her colonoscopies regularly.   The patient is s/p hysterectomy. She is retired from the [employer] of [employer].   Diagnostic mammogram/us: 09/11/2021   ACR Breast Density Category b: There are scattered areas of fibroglandular density.   FINDINGS: Within the lower inner left breast middle depth there is a persistent small focal mass, further evaluated with additional imaging.   Targeted ultrasound is performed, showing a 3 x 2 x 3 mm irregular hypoechoic mass left breast 7 o'clock position 4 cm from the nipple. No left axillary adenopathy.   IMPRESSION: Suspicious left breast mass 7 o'clock position.   RECOMMENDATION: Ultrasound-guided core needle biopsy left breast mass 7 o'clock position.   I have discussed the findings and recommendations with the patient. If applicable, a reminder letter will be sent to the patient regarding the next appointment.   BI-RADS CATEGORY 4: Suspicious.    Pathology core needle biopsy: 09/16/2021 Breast, left, needle core biopsy, 7:00 4 cmfn - INVASIVE DUCTAL CARCINOMA Based on the biopsy, the carcinoma appears Nottingham grade 1 of 3 and measures 0.4 cm in greatest linear  extent. Prognostic markers (ER/PR/ki-67/HER2) are pending and will be reported in an addendum. Dr. Spero Curb reviewed the  Review of Systems: A complete review of systems was obtained from the patient. I have reviewed this information and discussed as appropriate with the patient. See HPI as well for other ROS.  Medical History: Past Medical History:  Diagnosis Date   Arthritis   Patient Active Problem List  Diagnosis   Cancer of lower-inner quadrant of left female breast (CMS-HCC)   Past Surgical History:  Procedure Laterality Date   HYSTERECTOMY    No Known Allergies  No current outpatient medications on file prior to visit.   No current facility-administered medications on file prior to visit.   Family History  Problem Relation Age of Onset   Breast cancer Mother   Coronary Artery Disease (Blocked arteries around heart) Father   Colon cancer Sister    Social History   Tobacco Use  Smoking Status Never  Smokeless Tobacco Never    Social History   Socioeconomic History   Marital status: Divorced  Tobacco Use   Smoking status: Never   Smokeless tobacco: Never  Substance and Sexual Activity   Alcohol use: Never   Drug use: Never   Objective:   Vitals:  09/21/21 1350  BP: 138/72  Pulse: 61  Temp: 36.5 C (97.7 F)  SpO2: 98%  Weight: 55.2 kg (121 lb 9.6 oz)  Height: 157.5 cm (5' 2" )   Body mass index is 22.24 kg/m.  Gen: No acute distress. Well nourished and well groomed.  Neurological: Alert and oriented to person, place, and time. Coordination normal.  Head: Normocephalic and atraumatic.  Eyes: Conjunctivae are normal. Pupils are equal, round, and reactive to light. No scleral icterus.  Neck: Normal range of motion. Neck supple. No tracheal deviation or thyromegaly present.  Cardiovascular: Normal rate, regular rhythm, normal heart sounds and intact distal pulses. Exam reveals no gallop and no friction rub. No murmur heard. Breast: ptotic and  symmetric bilaterally. No palpable masses. No skin dimpling. No nipple retraction or nipple discharge. No LAD Respiratory: Effort normal. No respiratory distress. No chest wall tenderness. Breath sounds normal. No wheezes, rales or rhonchi.  GI: Soft. Bowel sounds are normal. The abdomen is soft and nontender. There is no rebound and no guarding.  Musculoskeletal: Normal range of motion. Extremities are nontender.  Lymphadenopathy: No cervical, preauricular, postauricular or axillary adenopathy is present Skin: Skin is warm and dry. No rash noted. No diaphoresis. No erythema. No pallor. No clubbing, cyanosis, or edema.  Psychiatric: Normal mood and affect. Behavior is normal. Judgment and thought content normal.   Assessment and Plan:   ICD-10-CM  1. Malignant neoplasm of lower-inner quadrant of left female breast, unspecified estrogen receptor status (CMS-HCC) C50.312    Pt has a new diagnosis of cT1a left breast cancer. As her receptor profile is hormone positive, this would be amenable to a left breast seed localized lumpectomy without a sentinel lymph node biopsy. If the mass stayed 3 mm, even if she had triple negative disease or HER2 positive disease, she would likely still not be offered chemotherapy given her age. However, if we find a triple negative tumor or a HER2 positive tumor, I would send her to see medical oncology preoperatively.  We will tentatively schedule her for a seed localized left breast lumpectomy. I will refer her to medical and radiation oncology. They would likely be able to give her the best information postop with the final pathology.  The surgical procedure was described to the patient. I discussed the incision type and location and that we would need radiology involved on with a wire or seed marker and/or sentinel node.   The risks and benefits of the procedure were described to the patient and she wishes to proceed.   We discussed the risks bleeding,  infection, damage to other structures, need for further procedures/surgeries. We discussed the risk of seroma. The patient was advised if the area in the breast in cancer, we may need to go back to surgery for additional tissue to obtain negative margins or for a lymph node biopsy. The patient was advised that these are the most common complications, but that others can occur as well. They were advised against taking aspirin or other anti-inflammatory agents/blood thinners the week before surgery.   No follow-ups on file.  Milus Height, MD FACS Surgical Oncology, General Surgery, Trauma and [facility]

## 2021-09-25 NOTE — Interval H&P Note (Signed)
History and Physical Interval Note:  09/25/2021 10:58 AM  Mary Simpson  has presented today for surgery, with the diagnosis of LEFT BREAST CANCER.  The various methods of treatment have been discussed with the patient and family. After consideration of risks, benefits and other options for treatment, the patient has consented to  Procedure(s): LEFT BREAST LUMPECTOMY WITH RADIOACTIVE SEED LOCALIZATION (Left) as a surgical intervention.  The patient's history has been reviewed, patient examined, no change in status, stable for surgery.  I have reviewed the patient's chart and labs.  Questions were answered to the patient's satisfaction.     Stark Klein

## 2021-09-25 NOTE — Discharge Instructions (Addendum)
[place] Office Phone Number [phone]  BREAST BIOPSY/ PARTIAL MASTECTOMY: POST OP INSTRUCTIONS  Always review your discharge instruction sheet given to you by the facility where your surgery was performed.  IF YOU HAVE DISABILITY OR FAMILY LEAVE FORMS, YOU MUST BRING THEM TO THE OFFICE FOR PROCESSING.  DO NOT GIVE THEM TO YOUR DOCTOR.  Take 2 tylenol (acetominophen) three times a day for 3 days.  If you still have pain, add ibuprofen with food in between if able to take this (if you have kidney issues or stomach issues, do not take ibuprofen).  If both of those are not enough, add the narcotic pain pill.  If you find you are needing a lot of this overnight after surgery, call the next morning for a refill.    Prescriptions will not be filled after 5pm or on week-ends. Take your usually prescribed medications unless otherwise directed You should eat very light the first 24 hours after surgery, such as soup, crackers, pudding, etc.  Resume your normal diet the day after surgery. Most patients will experience some swelling and bruising in the breast.  Ice packs and a good support bra will help.  Swelling and bruising can take several days to resolve.  It is common to experience some constipation if taking pain medication after surgery.  Increasing fluid intake and taking a stool softener will usually help or prevent this problem from occurring.  A mild laxative (Milk of Magnesia or Miralax) should be taken according to package directions if there are no bowel movements after 48 hours. Unless discharge instructions indicate otherwise, you may remove your bandages 48 hours after surgery, and you may shower at that time.  You may have steri-strips (small skin tapes) in place directly over the incision.  These strips should be left on the skin at least for for 7-10 days.    ACTIVITIES:  You may resume regular daily activities (gradually increasing) beginning the next day.  Wearing a  good support bra or sports bra (or the breast binder) minimizes pain and swelling.  You may have sexual intercourse when it is comfortable. No heavy lifting for 1-2 weeks (not over around 10 pounds).  You may drive when you no longer are taking prescription pain medication, you can comfortably wear a seatbelt, and you can safely maneuver your car and apply brakes. RETURN TO WORK:  __________3-14 days depending on job. _______________ Dennis Bast should see your doctor in the office for a follow-up appointment approximately two weeks after your surgery.  Your doctor's nurse will typically make your follow-up appointment when she calls you with your pathology report.  Expect your pathology report 3-4 business days after your surgery.  You may call to check if you do not hear from [place] after three days.   WHEN TO CALL YOUR DOCTOR: Fever over 101.0 Nausea and/or vomiting. Extreme swelling or bruising. Continued bleeding from incision. Increased pain, redness, or drainage from the incision.  The clinic staff is available to answer your questions during regular business hours.  Please don't hesitate to call and ask to speak to one of the nurses for clinical concerns.  If you have a medical emergency, go to the nearest emergency room or call 911.  A surgeon from [facility] is always on call at the hospital.  For further questions, please visit [email or web address]     No tylenol until after 5:15pm today, if needed.   Post Anesthesia Home Care Instructions  Activity: Get plenty of rest  for the remainder of the day. A responsible individual must stay with you for 24 hours following the procedure.  For the next 24 hours, DO NOT: -Drive a car -Paediatric nurse -Drink alcoholic beverages -Take any medication unless instructed by your physician -Make any legal decisions or sign important papers.  Meals: Start with liquid foods such as gelatin or soup. Progress to regular foods as  tolerated. Avoid greasy, spicy, heavy foods. If nausea and/or vomiting occur, drink only clear liquids until the nausea and/or vomiting subsides. Call your physician if vomiting continues.  Special Instructions/Symptoms: Your throat may feel dry or sore from the anesthesia or the breathing tube placed in your throat during surgery. If this causes discomfort, gargle with warm salt water. The discomfort should disappear within 24 hours.  If you had a scopolamine patch placed behind your ear for the management of post- operative nausea and/or vomiting:  1. The medication in the patch is effective for 72 hours, after which it should be removed.  Wrap patch in a tissue and discard in the trash. Wash hands thoroughly with soap and water. 2. You may remove the patch earlier than 72 hours if you experience unpleasant side effects which may include dry mouth, dizziness or visual disturbances. 3. Avoid touching the patch. Wash your hands with soap and water after contact with the patch.

## 2021-09-25 NOTE — Telephone Encounter (Signed)
5/19 @ 8:37 am could not leave voicemail/mailbox is full.  Called pt's son Ariyan Sinnett [phone] number is not in service.

## 2021-09-25 NOTE — Anesthesia Procedure Notes (Signed)
Procedure Name: LMA Insertion Date/Time: 09/25/2021 11:48 AM Performed by: Verita Lamb, CRNA Pre-anesthesia Checklist: Patient identified, Emergency Drugs available, Suction available and Patient being monitored Patient Re-evaluated:Patient Re-evaluated prior to induction Oxygen Delivery Method: Circle system utilized Preoxygenation: Pre-oxygenation with 100% oxygen Induction Type: IV induction Ventilation: Mask ventilation without difficulty LMA: LMA inserted LMA Size: 4.0 Number of attempts: 1 Airway Equipment and Method: Bite block Placement Confirmation: positive ETCO2, CO2 detector and breath sounds checked- equal and bilateral Tube secured with: Tape Dental Injury: Teeth and Oropharynx as per pre-operative assessment

## 2021-09-27 ENCOUNTER — Encounter (HOSPITAL_BASED_OUTPATIENT_CLINIC_OR_DEPARTMENT_OTHER): Payer: Self-pay | Admitting: General Surgery

## 2021-09-28 LAB — SURGICAL PATHOLOGY

## 2021-09-28 NOTE — Anesthesia Postprocedure Evaluation (Signed)
Anesthesia Post Note  Patient: Mary Simpson  Procedure(s) Performed: LEFT BREAST LUMPECTOMY WITH RADIOACTIVE SEED LOCALIZATION (Left: Breast)     Patient location during evaluation: PACU Anesthesia Type: General Level of consciousness: awake and alert Pain management: pain level controlled Vital Signs Assessment: post-procedure vital signs reviewed and stable Respiratory status: spontaneous breathing, nonlabored ventilation and respiratory function stable Cardiovascular status: blood pressure returned to baseline Postop Assessment: no apparent nausea or vomiting Anesthetic complications: no   No notable events documented.  Last Vitals:  Vitals:   09/25/21 1315 09/25/21 1342  BP: (!) 129/57 (!) 141/70  Pulse: 91 90  Resp: 19 14  Temp:  36.6 C  SpO2: 97% 95%    Last Pain:  Vitals:   09/25/21 1342  TempSrc:   PainSc: 0-No pain   Pain Goal:                   Marthenia Rolling

## 2021-10-08 ENCOUNTER — Other Ambulatory Visit: Payer: Medicare HMO

## 2021-10-08 ENCOUNTER — Ambulatory Visit: Payer: Medicare HMO | Admitting: Hematology and Oncology

## 2021-10-15 ENCOUNTER — Encounter (HOSPITAL_COMMUNITY): Payer: Self-pay

## 2021-10-15 NOTE — Progress Notes (Signed)
[facility]  [address] [place],  [place]  [address] [phone]  Clinic Day:  10/16/2021  Referring physician: Ronita Hipps, MD  HISTORY OF PRESENT ILLNESS:  Mary FUERSTENBERG is a 76 y.o. female who I was asked to consult upon for newly diagnosed breast cancer.  Her history dates back to early May 2023 when a diagnostic mammogram showed a 3 mm mass in the lower inner quadrant of her left breast.   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  This led to the patient undergoing a left breast lumpectomy 2 weeks later in May 2023, which revealed only a minimal amount of persistent invasive ductal carcinoma.  Altogether, her mass was less than 4 mm in size.Marland Kitchen  Receptor testing showed her tumor to be estrogen receptor positive, progesterone receptor positive, but Her2/neu receptor negative.  Of note, no sentinel lymph nodes were removed.  The patient comes in today to go over her breast cancer surgical pathology and its implications.  Of note, this patient did have mammograms and ultrasounds in 2021-22, which did not show any clearly abnormal findings, but it was recommended every 6 month follow-up studies to be done for continued surveillance.  The patient had been routinely getting annual mammograms due to her mother having breast cancer.  She denied ever having any symptoms which alerted her to breast cancer being present.  PAST MEDICAL HISTORY:   Past Medical History:  Diagnosis Date   Anxiety    Breast cancer ([facility])    Dyspnea    shortness of breathe with exertion   GERD (gastroesophageal reflux disease)    Osteoporosis    Ovarian cyst    Parkinson disease ([facility])    Scoliosis     PAST SURGICAL HISTORY:   Past Surgical History:  Procedure Laterality Date   ABDOMINAL HYSTERECTOMY     BREAST BIOPSY Left    BREAST LUMPECTOMY WITH RADIOACTIVE SEED LOCALIZATION Left 09/25/2021   Procedure: LEFT BREAST  LUMPECTOMY WITH RADIOACTIVE SEED LOCALIZATION;  Surgeon: Stark Klein, MD;  Location: [facility];  Service: General;  Laterality: Left;   CHOLECYSTECTOMY     CYSTOCELE REPAIR     WISDOM TOOTH EXTRACTION      CURRENT MEDICATIONS:   Current Outpatient Medications  Medication Sig Dispense Refill   alendronate (FOSAMAX) 70 MG tablet Take 70 mg by mouth once a week.     anastrozole (ARIMIDEX) 1 MG tablet Take 1 tablet (1 mg total) by mouth daily. 90 tablet 3   Ascorbic Acid (VITAMIN C PO) Take 1 tablet by mouth daily.     carbidopa-levodopa (SINEMET IR) 25-100 MG tablet Take 1 tablet by mouth 4 (four) times daily. 360 tablet 3   ferrous sulfate 325 (65 FE) MG tablet Take 325 mg by mouth daily with breakfast.     Multiple Vitamins-Minerals (MULTIVITAMIN WITH MINERALS) tablet Take 1 tablet by mouth daily. Woman's     oxyCODONE (OXY IR/ROXICODONE) 5 MG immediate release tablet Take 0.5-1 tablets (2.5-5 mg total) by mouth every 6 (six) hours as needed for severe pain. 8 tablet 0   pantoprazole (PROTONIX) 40 MG tablet Take 40 mg by mouth daily.     Potassium 99 MG TABS Take 99 mg by mouth daily.     sertraline (ZOLOFT) 50 MG tablet Take 25 mg by mouth at bedtime as needed for sleep.     temazepam (RESTORIL) 15 MG capsule Take 15  mg by mouth at bedtime.     No current facility-administered medications for this visit.    ALLERGIES:  No Known Allergies  FAMILY HISTORY:   Family History  Problem Relation Age of Onset   Breast cancer Mother    Heart attack Father    Colon cancer Sister    Alzheimer's disease Sister    Lymphoma Brother    Multiple myeloma Brother    SOCIAL HISTORY:  The patient was born and raised in [place].  She currently lives in town.  She is divorced, with 4 children and 13 grandchildren.  One of her children is deceased.  She worked at a [employer] for years.  She also worked at [employer].  There is no history  of alcoholism or tobacco abuse.  REVIEW OF SYSTEMS:  Review of Systems  Constitutional:  Negative for fatigue and fever.  HENT:   Negative for hearing loss and sore throat.   Eyes:  Negative for eye problems.  Respiratory:  Negative for chest tightness, cough and hemoptysis.   Cardiovascular:  Negative for chest pain and palpitations.  Gastrointestinal:  Negative for abdominal distention, abdominal pain, blood in stool, constipation, diarrhea, nausea and vomiting.  Endocrine: Negative for hot flashes.  Genitourinary:  Negative for difficulty urinating, dysuria, frequency, hematuria and nocturia.   Musculoskeletal:  Negative for arthralgias, back pain, gait problem and myalgias.  Skin: Negative.  Negative for itching and rash.  Neurological:  Positive for dizziness (Occasional vertigo). Negative for extremity weakness, gait problem, headaches, light-headedness and numbness.  Hematological: Negative.   Psychiatric/Behavioral:  Negative for depression and suicidal ideas. The patient is nervous/anxious.      PHYSICAL EXAM:  Blood pressure (!) 168/73, pulse 72, temperature 98 F (36.7 C), resp. rate 14, height 5' 2.5" (1.588 m), weight 122 lb 9.6 oz (55.6 kg), SpO2 97 %. Wt Readings from Last 3 Encounters:  10/16/21 122 lb 9.6 oz (55.6 kg)  09/25/21 119 lb 11.4 oz (54.3 kg)  03/27/18 136 lb (61.7 kg)   Body mass index is 22.07 kg/m. Performance status (ECOG): 1 - Symptomatic but completely ambulatory Physical Exam Constitutional:      Appearance: Normal appearance.  HENT:     Mouth/Throat:     Pharynx: Oropharynx is clear. No oropharyngeal exudate.  Cardiovascular:     Rate and Rhythm: Normal rate and regular rhythm.     Heart sounds: No murmur heard.    No friction rub. No gallop.  Pulmonary:     Breath sounds: Normal breath sounds.  Chest:  Breasts:    Right: No swelling, bleeding, inverted nipple, mass, nipple discharge or skin change.     Left: No swelling, bleeding,  inverted nipple, mass, nipple discharge or skin change.     Comments: Her left breast is healing well from her recent lumpectomy. Abdominal:     General: Bowel sounds are normal. There is no distension.     Palpations: Abdomen is soft. There is no mass.     Tenderness: There is no abdominal tenderness.  Musculoskeletal:        General: No tenderness.     Cervical back: Normal range of motion and neck supple.     Right lower leg: No edema.     Left lower leg: No edema.  Lymphadenopathy:     Cervical: No cervical adenopathy.     Right cervical: No superficial, deep or posterior cervical adenopathy.    Left cervical: No superficial,  deep or posterior cervical adenopathy.     Upper Body:     Right upper body: No supraclavicular or axillary adenopathy.     Left upper body: No supraclavicular or axillary adenopathy.     Lower Body: No right inguinal adenopathy. No left inguinal adenopathy.  Skin:    Coloration: Skin is not jaundiced.     Findings: No lesion or rash.  Neurological:     General: No focal deficit present.     Mental Status: She is alert and oriented to person, place, and time. Mental status is at baseline.  Psychiatric:        Mood and Affect: Mood normal.        Behavior: Behavior normal.        Thought Content: Thought content normal.        Judgment: Judgment normal.    ASSESSMENT & PLAN:   MURRAY GUZZETTA is a 76 y.o. 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  In clinic today, I went over all of her pathology with her, which showed no particularly ominous pathology.  As her breast cancer is hormone receptor positive, I will place her on anastrozole 1 mg daily for a total of 5 years of endocrine therapy.  As her lesion is very small and all surgical margins were negative, I do not believe there is a role for adjuvant breast radiation.  Moving forward, her  breast cancer surveillance will consist of her undergoing clinical breast exams every 4 months.  She will also continue to undergo yearly mammograms for her radiographic breast cancer surveillance.  Clinically, she is doing very well.  I will see her back in 4 months for repeat clinical assessment.  The patient understands all the plans discussed today and is in agreement with them.  I do appreciate Ronita Hipps, MD for his new consult.   Costa Jha Macarthur Critchley, MD

## 2021-10-16 ENCOUNTER — Other Ambulatory Visit: Payer: Self-pay | Admitting: Oncology

## 2021-10-16 ENCOUNTER — Encounter: Payer: Self-pay | Admitting: Oncology

## 2021-10-16 ENCOUNTER — Other Ambulatory Visit: Payer: Self-pay

## 2021-10-16 ENCOUNTER — Inpatient Hospital Stay: Payer: Medicare HMO

## 2021-10-16 ENCOUNTER — Inpatient Hospital Stay: Payer: Medicare HMO | Attending: Oncology | Admitting: Oncology

## 2021-10-16 VITALS — BP 168/73 | HR 72 | Temp 98.0°F | Resp 14 | Ht 62.5 in | Wt 122.6 lb

## 2021-10-16 DIAGNOSIS — C50312 Malignant neoplasm of lower-inner quadrant of left female breast: Secondary | ICD-10-CM

## 2021-10-16 DIAGNOSIS — F419 Anxiety disorder, unspecified: Secondary | ICD-10-CM | POA: Insufficient documentation

## 2021-10-16 DIAGNOSIS — Z803 Family history of malignant neoplasm of breast: Secondary | ICD-10-CM | POA: Insufficient documentation

## 2021-10-16 DIAGNOSIS — K219 Gastro-esophageal reflux disease without esophagitis: Secondary | ICD-10-CM | POA: Insufficient documentation

## 2021-10-16 DIAGNOSIS — Z170 Estrogen receptor positive status [ER+]: Secondary | ICD-10-CM | POA: Diagnosis not present

## 2021-10-16 DIAGNOSIS — Z79899 Other long term (current) drug therapy: Secondary | ICD-10-CM | POA: Insufficient documentation

## 2021-10-16 DIAGNOSIS — G20 Parkinson's disease: Secondary | ICD-10-CM | POA: Diagnosis not present

## 2021-10-16 DIAGNOSIS — Z7983 Long term (current) use of bisphosphonates: Secondary | ICD-10-CM | POA: Insufficient documentation

## 2021-10-16 DIAGNOSIS — M419 Scoliosis, unspecified: Secondary | ICD-10-CM | POA: Insufficient documentation

## 2021-10-16 DIAGNOSIS — Z79811 Long term (current) use of aromatase inhibitors: Secondary | ICD-10-CM | POA: Insufficient documentation

## 2021-10-16 DIAGNOSIS — R0600 Dyspnea, unspecified: Secondary | ICD-10-CM | POA: Insufficient documentation

## 2021-10-16 DIAGNOSIS — M810 Age-related osteoporosis without current pathological fracture: Secondary | ICD-10-CM | POA: Insufficient documentation

## 2021-10-16 MED ORDER — ANASTROZOLE 1 MG PO TABS: 1.0000 mg | ORAL_TABLET | Freq: Every day | ORAL | 3 refills | Status: AC

## 2021-10-22 ENCOUNTER — Telehealth: Payer: Self-pay

## 2021-10-22 NOTE — Telephone Encounter (Signed)
Patient made aware will get some imodium and take as directed also try tylenol for headache and call us with any further concerns.

## 2021-10-22 NOTE — Telephone Encounter (Signed)
-----   Message from Marvia Pickles, PA-C sent at 10/22/2021 12:20 PM EDT ----- Both are rare side effects of anastrozole. I would encourage her to try to continue to see if there may be improvement after being on the medication a bit longer. She can try Tylenol for headache and Imodium for diarrhea. ----- Message ----- From: Daryel November, LPN Sent: 1/73/5670  12:13 PM EDT To: Marvia Pickles, PA-C  Patient complaint of headaches and diarrhea since starting anastrozole.

## 2021-10-27 ENCOUNTER — Other Ambulatory Visit: Payer: Medicare HMO

## 2021-10-27 ENCOUNTER — Encounter: Payer: Medicare HMO | Admitting: Genetic Counselor

## 2022-02-15 ENCOUNTER — Telehealth: Payer: Self-pay | Admitting: Oncology

## 2022-02-15 ENCOUNTER — Inpatient Hospital Stay: Payer: Medicare HMO | Attending: Oncology | Admitting: Oncology

## 2022-02-15 VITALS — BP 151/67 | HR 76 | Temp 97.8°F | Resp 14 | Ht 62.5 in | Wt 120.2 lb

## 2022-02-15 DIAGNOSIS — Z170 Estrogen receptor positive status [ER+]: Secondary | ICD-10-CM

## 2022-02-15 DIAGNOSIS — Z23 Encounter for immunization: Secondary | ICD-10-CM | POA: Diagnosis not present

## 2022-02-15 DIAGNOSIS — C50312 Malignant neoplasm of lower-inner quadrant of left female breast: Secondary | ICD-10-CM

## 2022-02-15 NOTE — Progress Notes (Signed)
[facility]  [address] [place],  [place]  [address] [phone]  Clinic Day:  02/15/2022  Referring physician: Ronita Hipps, MD  HISTORY OF PRESENT ILLNESS:  Mary Simpson is a 76 y.o. 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  She currently takes anastrozole for her adjuvant endocrine therapy.  She comes in today for routine follow-up.  Since her last visit, the patient has been doing well.  She denies having any particular changes in her breasts which concern her for early disease recurrence.    PHYSICAL EXAM:  Blood pressure (!) 151/67, pulse 76, temperature 97.8 F (36.6 C), resp. rate 14, height 5' 2.5" (1.588 m), weight 120 lb 3.2 oz (54.5 kg), SpO2 95 %. Wt Readings from Last 3 Encounters:  02/15/22 120 lb 3.2 oz (54.5 kg)  10/16/21 122 lb 9.6 oz (55.6 kg)  09/25/21 119 lb 11.4 oz (54.3 kg)   Body mass index is 21.63 kg/m. Performance status (ECOG): 1 - Symptomatic but completely ambulatory Physical Exam Constitutional:      Appearance: Normal appearance.  HENT:     Mouth/Throat:     Pharynx: Oropharynx is clear. No oropharyngeal exudate.  Cardiovascular:     Rate and Rhythm: Normal rate and regular rhythm.     Heart sounds: No murmur heard.    No friction rub. No gallop.  Pulmonary:     Breath sounds: Normal breath sounds.  Chest:  Breasts:    Right: No swelling, bleeding, inverted nipple, mass, nipple discharge or skin change.     Left: No swelling, bleeding, inverted nipple, mass, nipple discharge or skin change.  Abdominal:     General: Bowel sounds are normal. There is no distension.     Palpations: Abdomen is soft. There is no mass.     Tenderness: There is no abdominal tenderness.  Musculoskeletal:        General: No tenderness.     Cervical back: Normal range of motion and neck supple.     Right lower leg: No edema.     Left lower leg: No  edema.  Lymphadenopathy:     Cervical: No cervical adenopathy.     Right cervical: No superficial, deep or posterior cervical adenopathy.    Left cervical: No superficial, deep or posterior cervical adenopathy.     Upper Body:     Right upper body: No supraclavicular or axillary adenopathy.     Left upper body: No supraclavicular or axillary adenopathy.     Lower Body: No right inguinal adenopathy. No left inguinal adenopathy.  Skin:    Coloration: Skin is not jaundiced.     Findings: No lesion or rash.  Neurological:     General: No focal deficit present.     Mental Status: She is alert and oriented to person, place, and time. Mental status is at baseline.  Psychiatric:        Mood and Affect: Mood normal.        Behavior: Behavior normal.        Thought Content: Thought content normal.        Judgment: Judgment normal.    ASSESSMENT & PLAN:   A 76 y.o. 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  Based upon her clinical breast exam today, the patient remains disease-free.  The  patient knows to continue taking her anastrozole daily to complete 5 total years of endocrine therapy. Clinically, she is doing very well.  I will see her back in 4 months for repeat clinical assessment.  The patient understands all the plans discussed today and is in agreement with them.  Clifton Kovacic Macarthur Critchley, MD

## 2022-02-15 NOTE — Telephone Encounter (Signed)
02/15/22 next appt scheduled and confirmed with patient

## 2022-02-17 DIAGNOSIS — Z6822 Body mass index (BMI) 22.0-22.9, adult: Secondary | ICD-10-CM | POA: Diagnosis not present

## 2022-02-17 DIAGNOSIS — M542 Cervicalgia: Secondary | ICD-10-CM | POA: Diagnosis not present

## 2022-03-08 ENCOUNTER — Other Ambulatory Visit: Payer: Self-pay

## 2022-03-15 ENCOUNTER — Other Ambulatory Visit: Payer: Self-pay | Admitting: Family Medicine

## 2022-03-15 DIAGNOSIS — Z853 Personal history of malignant neoplasm of breast: Secondary | ICD-10-CM

## 2022-03-15 DIAGNOSIS — C50912 Malignant neoplasm of unspecified site of left female breast: Secondary | ICD-10-CM | POA: Diagnosis not present

## 2022-03-15 DIAGNOSIS — Z6821 Body mass index (BMI) 21.0-21.9, adult: Secondary | ICD-10-CM | POA: Diagnosis not present

## 2022-03-15 DIAGNOSIS — Z1331 Encounter for screening for depression: Secondary | ICD-10-CM | POA: Diagnosis not present

## 2022-03-15 DIAGNOSIS — Z79899 Other long term (current) drug therapy: Secondary | ICD-10-CM | POA: Diagnosis not present

## 2022-03-15 DIAGNOSIS — Z0000 Encounter for general adult medical examination without abnormal findings: Secondary | ICD-10-CM | POA: Diagnosis not present

## 2022-03-15 DIAGNOSIS — E782 Mixed hyperlipidemia: Secondary | ICD-10-CM | POA: Diagnosis not present

## 2022-06-17 NOTE — Progress Notes (Signed)
[facility]  [address] [place],  [place]  [address] [phone]  Clinic Day:  06/18/2022  Referring physician: Ronita Hipps, MD  HISTORY OF PRESENT ILLNESS:  Mary Simpson is a 77 y.o. 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  She currently takes anastrozole for her adjuvant endocrine therapy.  She comes in today for routine follow-up.  Since her last visit, the patient has been doing well.  She denies having any particular changes in her breasts which concern her for early disease recurrence.  Of note, she has been diagnosed with Parkinson's disease, for which she is taking medication to subdue.    PHYSICAL EXAM:  Blood pressure 134/72, pulse 81, temperature 97.8 F (36.6 C), resp. rate 14, height 5' 2.5" (1.588 m), weight 114 lb 3.2 oz (51.8 kg), SpO2 95 %. Wt Readings from Last 3 Encounters:  06/18/22 114 lb 3.2 oz (51.8 kg)  02/15/22 120 lb 3.2 oz (54.5 kg)  10/16/21 122 lb 9.6 oz (55.6 kg)   Body mass index is 20.55 kg/m. Performance status (ECOG): 1 - Symptomatic but completely ambulatory Physical Exam Constitutional:      Appearance: Normal appearance.  HENT:     Mouth/Throat:     Pharynx: Oropharynx is clear. No oropharyngeal exudate.  Cardiovascular:     Rate and Rhythm: Normal rate and regular rhythm.     Heart sounds: No murmur heard.    No friction rub. No gallop.  Pulmonary:     Breath sounds: Normal breath sounds.  Chest:  Breasts:    Right: No swelling, bleeding, inverted nipple, mass, nipple discharge or skin change.     Left: No swelling, bleeding, inverted nipple, mass, nipple discharge or skin change.  Abdominal:     General: Bowel sounds are normal. There is no distension.     Palpations: Abdomen is soft. There is no mass.     Tenderness: There is no abdominal tenderness.  Musculoskeletal:        General: No tenderness.     Cervical  back: Normal range of motion and neck supple.     Right lower leg: No edema.     Left lower leg: No edema.  Lymphadenopathy:     Cervical: No cervical adenopathy.     Right cervical: No superficial, deep or posterior cervical adenopathy.    Left cervical: No superficial, deep or posterior cervical adenopathy.     Upper Body:     Right upper body: No supraclavicular or axillary adenopathy.     Left upper body: No supraclavicular or axillary adenopathy.     Lower Body: No right inguinal adenopathy. No left inguinal adenopathy.  Skin:    Coloration: Skin is not jaundiced.     Findings: No lesion or rash.  Neurological:     General: No focal deficit present.     Mental Status: She is alert and oriented to person, place, and time. Mental status is at baseline.  Psychiatric:        Mood and Affect: Mood normal.        Behavior: Behavior normal.        Thought Content: Thought content normal.        Judgment: Judgment normal.    ASSESSMENT & PLAN:   A 77 y.o. 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  Based upon her clinical breast exam today, the patient remains disease-free.  The patient knows to continue taking her anastrozole daily to complete 5 total years of endocrine therapy. Clinically, she is doing very well.  I will see her back in 4 months for repeat clinical assessment.  Her annual mammogram will be done before next visit for her continued radiographic breast cancer surveillance.  A bone density study will also be ordered before her next visit to ensure her anastrozole is not leading to any significant bone loss.  The patient understands all the plans discussed today and is in agreement with them.  Keymani Glynn Macarthur Critchley, MD

## 2022-06-18 ENCOUNTER — Inpatient Hospital Stay: Payer: Medicare HMO | Attending: Oncology | Admitting: Oncology

## 2022-06-18 VITALS — BP 134/72 | HR 81 | Temp 97.8°F | Resp 14 | Ht 62.5 in | Wt 114.2 lb

## 2022-06-18 DIAGNOSIS — C50312 Malignant neoplasm of lower-inner quadrant of left female breast: Secondary | ICD-10-CM | POA: Diagnosis not present

## 2022-06-18 DIAGNOSIS — Z170 Estrogen receptor positive status [ER+]: Secondary | ICD-10-CM

## 2022-06-30 DIAGNOSIS — G20A1 Parkinson's disease without dyskinesia, without mention of fluctuations: Secondary | ICD-10-CM | POA: Diagnosis not present

## 2022-07-12 DIAGNOSIS — Z6821 Body mass index (BMI) 21.0-21.9, adult: Secondary | ICD-10-CM | POA: Diagnosis not present

## 2022-07-12 DIAGNOSIS — G4700 Insomnia, unspecified: Secondary | ICD-10-CM | POA: Diagnosis not present

## 2022-08-30 DIAGNOSIS — R928 Other abnormal and inconclusive findings on diagnostic imaging of breast: Secondary | ICD-10-CM | POA: Diagnosis not present

## 2022-08-30 DIAGNOSIS — Z853 Personal history of malignant neoplasm of breast: Secondary | ICD-10-CM | POA: Diagnosis not present

## 2022-08-30 DIAGNOSIS — R92323 Mammographic fibroglandular density, bilateral breasts: Secondary | ICD-10-CM | POA: Diagnosis not present

## 2022-08-30 DIAGNOSIS — C50912 Malignant neoplasm of unspecified site of left female breast: Secondary | ICD-10-CM | POA: Diagnosis not present

## 2022-09-01 DIAGNOSIS — M810 Age-related osteoporosis without current pathological fracture: Secondary | ICD-10-CM | POA: Diagnosis not present

## 2022-09-01 LAB — HM DEXA SCAN

## 2022-09-14 ENCOUNTER — Encounter: Payer: Self-pay | Admitting: Oncology

## 2022-09-19 DIAGNOSIS — G20A1 Parkinson's disease without dyskinesia, without mention of fluctuations: Secondary | ICD-10-CM | POA: Diagnosis not present

## 2022-09-19 DIAGNOSIS — Z043 Encounter for examination and observation following other accident: Secondary | ICD-10-CM | POA: Diagnosis not present

## 2022-09-19 DIAGNOSIS — M19012 Primary osteoarthritis, left shoulder: Secondary | ICD-10-CM | POA: Diagnosis not present

## 2022-09-19 DIAGNOSIS — M25551 Pain in right hip: Secondary | ICD-10-CM | POA: Diagnosis not present

## 2022-09-19 DIAGNOSIS — M25552 Pain in left hip: Secondary | ICD-10-CM | POA: Diagnosis not present

## 2022-09-19 DIAGNOSIS — M25519 Pain in unspecified shoulder: Secondary | ICD-10-CM | POA: Diagnosis not present

## 2022-09-19 DIAGNOSIS — Z87440 Personal history of urinary (tract) infections: Secondary | ICD-10-CM | POA: Diagnosis not present

## 2022-09-19 DIAGNOSIS — W19XXXA Unspecified fall, initial encounter: Secondary | ICD-10-CM | POA: Diagnosis not present

## 2022-09-19 DIAGNOSIS — M25512 Pain in left shoulder: Secondary | ICD-10-CM | POA: Diagnosis not present

## 2022-09-19 DIAGNOSIS — Z79899 Other long term (current) drug therapy: Secondary | ICD-10-CM | POA: Diagnosis not present

## 2022-10-01 DIAGNOSIS — G20A1 Parkinson's disease without dyskinesia, without mention of fluctuations: Secondary | ICD-10-CM | POA: Diagnosis not present

## 2022-10-17 NOTE — Progress Notes (Unsigned)
[facility]  [address] [place],  [place]  [address] [phone]  Clinic Day:  10/18/2022  Referring physician: Marylen Ponto, MD  HISTORY OF PRESENT ILLNESS:  Mary Simpson is a 77 y.o. 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  She currently takes anastrozole for her adjuvant endocrine therapy.  She comes in today for routine follow-up.  Since her last visit, the patient has been doing well.  She denies having any particular changes in her breasts which concern her for early disease recurrence.  Of note, her annual mammogram in April 2024 showed no evidence of disease recurrence.  However, her bone density study showed severe osteoporosis with a T-score of -5.8.  PHYSICAL EXAM:  Blood pressure (!) 149/65, pulse 68, temperature (!) 97.5 F (36.4 C), resp. rate 14, height 5' 2.5" (1.588 m), weight 112 lb 11.2 oz (51.1 kg), SpO2 96 %. Wt Readings from Last 3 Encounters:  10/18/22 112 lb 11.2 oz (51.1 kg)  06/18/22 114 lb 3.2 oz (51.8 kg)  02/15/22 120 lb 3.2 oz (54.5 kg)   Body mass index is 20.28 kg/m. Performance status (ECOG): 1 - Symptomatic but completely ambulatory Physical Exam Constitutional:      Appearance: Normal appearance.  HENT:     Mouth/Throat:     Pharynx: Oropharynx is clear. No oropharyngeal exudate.  Cardiovascular:     Rate and Rhythm: Normal rate and regular rhythm.     Heart sounds: No murmur heard.    No friction rub. No gallop.  Pulmonary:     Breath sounds: Normal breath sounds.  Chest:  Breasts:    Right: No swelling, bleeding, inverted nipple, mass, nipple discharge or skin change.     Left: No swelling, bleeding, inverted nipple, mass, nipple discharge or skin change.  Abdominal:     General: Bowel sounds are normal. There is no distension.     Palpations: Abdomen is soft. There is no mass.     Tenderness: There is no abdominal  tenderness.  Musculoskeletal:        General: No tenderness.     Cervical back: Normal range of motion and neck supple.     Right lower leg: No edema.     Left lower leg: No edema.  Lymphadenopathy:     Cervical: No cervical adenopathy.     Right cervical: No superficial, deep or posterior cervical adenopathy.    Left cervical: No superficial, deep or posterior cervical adenopathy.     Upper Body:     Right upper body: No supraclavicular or axillary adenopathy.     Left upper body: No supraclavicular or axillary adenopathy.     Lower Body: No right inguinal adenopathy. No left inguinal adenopathy.  Skin:    Coloration: Skin is not jaundiced.     Findings: No lesion or rash.  Neurological:     General: No focal deficit present.     Mental Status: She is alert and oriented to person, place, and time. Mental status is at baseline.  Psychiatric:        Mood and Affect: Mood normal.        Behavior: Behavior normal.        Thought Content: Thought content normal.        Judgment: Judgment normal.   ASSESSMENT & PLAN:   A 77 y.o. female with stage IA (T1a N0 M0) hormone positive  breast cancer, status post a left breast lumpectomy in May 2023.  Based upon her clinical breast exam today and recent mammogram, the patient remains disease-free.  The patient knows to continue taking her anastrozole daily to complete 5 total years of endocrine therapy.  As she does have osteoporosis, she does continue taking her alendronate on a weekly basis.  I also encouraged her to take calcium/vitamin D on a daily basis to ensure optimization of better bone health.  Clinically, she is doing  okay.  I will see her back in 4 months for repeat clinical assessment.  The patient understands all the plans discussed today and is in agreement with them.  Aala Ransom Kirby Funk, MD

## 2022-10-18 ENCOUNTER — Inpatient Hospital Stay: Payer: Medicare HMO | Attending: Oncology | Admitting: Oncology

## 2022-10-18 ENCOUNTER — Other Ambulatory Visit: Payer: Self-pay | Admitting: Oncology

## 2022-10-18 VITALS — BP 149/65 | HR 68 | Temp 97.5°F | Resp 14 | Ht 62.5 in | Wt 112.7 lb

## 2022-10-18 DIAGNOSIS — C50312 Malignant neoplasm of lower-inner quadrant of left female breast: Secondary | ICD-10-CM | POA: Diagnosis not present

## 2022-10-18 DIAGNOSIS — Z170 Estrogen receptor positive status [ER+]: Secondary | ICD-10-CM | POA: Diagnosis not present

## 2022-10-18 MED ORDER — OYSTER SHELL CALCIUM/D3 500-5 MG-MCG PO TABS: 1.0000 | ORAL_TABLET | Freq: Two times a day (BID) | ORAL | 11 refills | Status: AC

## 2023-01-19 DIAGNOSIS — K219 Gastro-esophageal reflux disease without esophagitis: Secondary | ICD-10-CM | POA: Diagnosis not present

## 2023-01-19 DIAGNOSIS — I951 Orthostatic hypotension: Secondary | ICD-10-CM | POA: Diagnosis not present

## 2023-01-19 DIAGNOSIS — G20A1 Parkinson's disease without dyskinesia, without mention of fluctuations: Secondary | ICD-10-CM | POA: Diagnosis not present

## 2023-01-19 DIAGNOSIS — R531 Weakness: Secondary | ICD-10-CM | POA: Diagnosis not present

## 2023-01-19 DIAGNOSIS — R42 Dizziness and giddiness: Secondary | ICD-10-CM | POA: Diagnosis not present

## 2023-01-19 DIAGNOSIS — Z79811 Long term (current) use of aromatase inhibitors: Secondary | ICD-10-CM | POA: Diagnosis not present

## 2023-01-19 DIAGNOSIS — W19XXXA Unspecified fall, initial encounter: Secondary | ICD-10-CM | POA: Diagnosis not present

## 2023-01-19 DIAGNOSIS — Z853 Personal history of malignant neoplasm of breast: Secondary | ICD-10-CM | POA: Diagnosis not present

## 2023-01-19 DIAGNOSIS — Z79899 Other long term (current) drug therapy: Secondary | ICD-10-CM | POA: Diagnosis not present

## 2023-01-19 DIAGNOSIS — I672 Cerebral atherosclerosis: Secondary | ICD-10-CM | POA: Diagnosis not present

## 2023-01-27 DIAGNOSIS — I951 Orthostatic hypotension: Secondary | ICD-10-CM | POA: Diagnosis not present

## 2023-01-27 DIAGNOSIS — Z681 Body mass index (BMI) 19 or less, adult: Secondary | ICD-10-CM | POA: Diagnosis not present

## 2023-01-27 DIAGNOSIS — R296 Repeated falls: Secondary | ICD-10-CM | POA: Diagnosis not present

## 2023-01-31 DIAGNOSIS — Z6820 Body mass index (BMI) 20.0-20.9, adult: Secondary | ICD-10-CM | POA: Diagnosis not present

## 2023-01-31 DIAGNOSIS — R296 Repeated falls: Secondary | ICD-10-CM | POA: Diagnosis not present

## 2023-01-31 DIAGNOSIS — F410 Panic disorder [episodic paroxysmal anxiety] without agoraphobia: Secondary | ICD-10-CM | POA: Diagnosis not present

## 2023-01-31 DIAGNOSIS — G20A1 Parkinson's disease without dyskinesia, without mention of fluctuations: Secondary | ICD-10-CM | POA: Diagnosis not present

## 2023-01-31 DIAGNOSIS — M810 Age-related osteoporosis without current pathological fracture: Secondary | ICD-10-CM | POA: Diagnosis not present

## 2023-01-31 DIAGNOSIS — G4700 Insomnia, unspecified: Secondary | ICD-10-CM | POA: Diagnosis not present

## 2023-01-31 DIAGNOSIS — M1990 Unspecified osteoarthritis, unspecified site: Secondary | ICD-10-CM | POA: Diagnosis not present

## 2023-01-31 DIAGNOSIS — K219 Gastro-esophageal reflux disease without esophagitis: Secondary | ICD-10-CM | POA: Diagnosis not present

## 2023-01-31 DIAGNOSIS — I951 Orthostatic hypotension: Secondary | ICD-10-CM | POA: Diagnosis not present

## 2023-01-31 DIAGNOSIS — Z853 Personal history of malignant neoplasm of breast: Secondary | ICD-10-CM | POA: Diagnosis not present

## 2023-01-31 DIAGNOSIS — R300 Dysuria: Secondary | ICD-10-CM | POA: Diagnosis not present

## 2023-02-02 DIAGNOSIS — Z853 Personal history of malignant neoplasm of breast: Secondary | ICD-10-CM | POA: Diagnosis not present

## 2023-02-02 DIAGNOSIS — G4700 Insomnia, unspecified: Secondary | ICD-10-CM | POA: Diagnosis not present

## 2023-02-02 DIAGNOSIS — K219 Gastro-esophageal reflux disease without esophagitis: Secondary | ICD-10-CM | POA: Diagnosis not present

## 2023-02-02 DIAGNOSIS — M1990 Unspecified osteoarthritis, unspecified site: Secondary | ICD-10-CM | POA: Diagnosis not present

## 2023-02-02 DIAGNOSIS — I951 Orthostatic hypotension: Secondary | ICD-10-CM | POA: Diagnosis not present

## 2023-02-02 DIAGNOSIS — R296 Repeated falls: Secondary | ICD-10-CM | POA: Diagnosis not present

## 2023-02-02 DIAGNOSIS — F410 Panic disorder [episodic paroxysmal anxiety] without agoraphobia: Secondary | ICD-10-CM | POA: Diagnosis not present

## 2023-02-02 DIAGNOSIS — M810 Age-related osteoporosis without current pathological fracture: Secondary | ICD-10-CM | POA: Diagnosis not present

## 2023-02-02 DIAGNOSIS — G20A1 Parkinson's disease without dyskinesia, without mention of fluctuations: Secondary | ICD-10-CM | POA: Diagnosis not present

## 2023-02-03 DIAGNOSIS — G20A1 Parkinson's disease without dyskinesia, without mention of fluctuations: Secondary | ICD-10-CM | POA: Diagnosis not present

## 2023-02-03 DIAGNOSIS — G4700 Insomnia, unspecified: Secondary | ICD-10-CM | POA: Diagnosis not present

## 2023-02-03 DIAGNOSIS — K219 Gastro-esophageal reflux disease without esophagitis: Secondary | ICD-10-CM | POA: Diagnosis not present

## 2023-02-03 DIAGNOSIS — I951 Orthostatic hypotension: Secondary | ICD-10-CM | POA: Diagnosis not present

## 2023-02-03 DIAGNOSIS — R296 Repeated falls: Secondary | ICD-10-CM | POA: Diagnosis not present

## 2023-02-03 DIAGNOSIS — M810 Age-related osteoporosis without current pathological fracture: Secondary | ICD-10-CM | POA: Diagnosis not present

## 2023-02-03 DIAGNOSIS — Z853 Personal history of malignant neoplasm of breast: Secondary | ICD-10-CM | POA: Diagnosis not present

## 2023-02-03 DIAGNOSIS — M1990 Unspecified osteoarthritis, unspecified site: Secondary | ICD-10-CM | POA: Diagnosis not present

## 2023-02-03 DIAGNOSIS — F410 Panic disorder [episodic paroxysmal anxiety] without agoraphobia: Secondary | ICD-10-CM | POA: Diagnosis not present

## 2023-02-04 DIAGNOSIS — M1990 Unspecified osteoarthritis, unspecified site: Secondary | ICD-10-CM | POA: Diagnosis not present

## 2023-02-04 DIAGNOSIS — Z853 Personal history of malignant neoplasm of breast: Secondary | ICD-10-CM | POA: Diagnosis not present

## 2023-02-04 DIAGNOSIS — K219 Gastro-esophageal reflux disease without esophagitis: Secondary | ICD-10-CM | POA: Diagnosis not present

## 2023-02-04 DIAGNOSIS — R296 Repeated falls: Secondary | ICD-10-CM | POA: Diagnosis not present

## 2023-02-04 DIAGNOSIS — F410 Panic disorder [episodic paroxysmal anxiety] without agoraphobia: Secondary | ICD-10-CM | POA: Diagnosis not present

## 2023-02-04 DIAGNOSIS — M810 Age-related osteoporosis without current pathological fracture: Secondary | ICD-10-CM | POA: Diagnosis not present

## 2023-02-04 DIAGNOSIS — I951 Orthostatic hypotension: Secondary | ICD-10-CM | POA: Diagnosis not present

## 2023-02-04 DIAGNOSIS — G4700 Insomnia, unspecified: Secondary | ICD-10-CM | POA: Diagnosis not present

## 2023-02-04 DIAGNOSIS — G20A1 Parkinson's disease without dyskinesia, without mention of fluctuations: Secondary | ICD-10-CM | POA: Diagnosis not present

## 2023-02-05 DIAGNOSIS — R296 Repeated falls: Secondary | ICD-10-CM | POA: Diagnosis not present

## 2023-02-05 DIAGNOSIS — Z853 Personal history of malignant neoplasm of breast: Secondary | ICD-10-CM | POA: Diagnosis not present

## 2023-02-05 DIAGNOSIS — I951 Orthostatic hypotension: Secondary | ICD-10-CM | POA: Diagnosis not present

## 2023-02-05 DIAGNOSIS — F410 Panic disorder [episodic paroxysmal anxiety] without agoraphobia: Secondary | ICD-10-CM | POA: Diagnosis not present

## 2023-02-05 DIAGNOSIS — M810 Age-related osteoporosis without current pathological fracture: Secondary | ICD-10-CM | POA: Diagnosis not present

## 2023-02-05 DIAGNOSIS — K219 Gastro-esophageal reflux disease without esophagitis: Secondary | ICD-10-CM | POA: Diagnosis not present

## 2023-02-05 DIAGNOSIS — G4700 Insomnia, unspecified: Secondary | ICD-10-CM | POA: Diagnosis not present

## 2023-02-05 DIAGNOSIS — M1990 Unspecified osteoarthritis, unspecified site: Secondary | ICD-10-CM | POA: Diagnosis not present

## 2023-02-05 DIAGNOSIS — G20A1 Parkinson's disease without dyskinesia, without mention of fluctuations: Secondary | ICD-10-CM | POA: Diagnosis not present

## 2023-02-07 DIAGNOSIS — M810 Age-related osteoporosis without current pathological fracture: Secondary | ICD-10-CM | POA: Diagnosis not present

## 2023-02-07 DIAGNOSIS — M1990 Unspecified osteoarthritis, unspecified site: Secondary | ICD-10-CM | POA: Diagnosis not present

## 2023-02-07 DIAGNOSIS — G20A1 Parkinson's disease without dyskinesia, without mention of fluctuations: Secondary | ICD-10-CM | POA: Diagnosis not present

## 2023-02-07 DIAGNOSIS — K219 Gastro-esophageal reflux disease without esophagitis: Secondary | ICD-10-CM | POA: Diagnosis not present

## 2023-02-07 DIAGNOSIS — R296 Repeated falls: Secondary | ICD-10-CM | POA: Diagnosis not present

## 2023-02-07 DIAGNOSIS — I951 Orthostatic hypotension: Secondary | ICD-10-CM | POA: Diagnosis not present

## 2023-02-07 DIAGNOSIS — Z853 Personal history of malignant neoplasm of breast: Secondary | ICD-10-CM | POA: Diagnosis not present

## 2023-02-07 DIAGNOSIS — G4700 Insomnia, unspecified: Secondary | ICD-10-CM | POA: Diagnosis not present

## 2023-02-07 DIAGNOSIS — F410 Panic disorder [episodic paroxysmal anxiety] without agoraphobia: Secondary | ICD-10-CM | POA: Diagnosis not present

## 2023-02-08 DIAGNOSIS — M1990 Unspecified osteoarthritis, unspecified site: Secondary | ICD-10-CM | POA: Diagnosis not present

## 2023-02-08 DIAGNOSIS — F410 Panic disorder [episodic paroxysmal anxiety] without agoraphobia: Secondary | ICD-10-CM | POA: Diagnosis not present

## 2023-02-08 DIAGNOSIS — Z853 Personal history of malignant neoplasm of breast: Secondary | ICD-10-CM | POA: Diagnosis not present

## 2023-02-08 DIAGNOSIS — K219 Gastro-esophageal reflux disease without esophagitis: Secondary | ICD-10-CM | POA: Diagnosis not present

## 2023-02-08 DIAGNOSIS — G20A1 Parkinson's disease without dyskinesia, without mention of fluctuations: Secondary | ICD-10-CM | POA: Diagnosis not present

## 2023-02-08 DIAGNOSIS — I951 Orthostatic hypotension: Secondary | ICD-10-CM | POA: Diagnosis not present

## 2023-02-08 DIAGNOSIS — G4700 Insomnia, unspecified: Secondary | ICD-10-CM | POA: Diagnosis not present

## 2023-02-08 DIAGNOSIS — R296 Repeated falls: Secondary | ICD-10-CM | POA: Diagnosis not present

## 2023-02-08 DIAGNOSIS — M810 Age-related osteoporosis without current pathological fracture: Secondary | ICD-10-CM | POA: Diagnosis not present

## 2023-02-09 DIAGNOSIS — K219 Gastro-esophageal reflux disease without esophagitis: Secondary | ICD-10-CM | POA: Diagnosis not present

## 2023-02-09 DIAGNOSIS — G20A1 Parkinson's disease without dyskinesia, without mention of fluctuations: Secondary | ICD-10-CM | POA: Diagnosis not present

## 2023-02-09 DIAGNOSIS — F410 Panic disorder [episodic paroxysmal anxiety] without agoraphobia: Secondary | ICD-10-CM | POA: Diagnosis not present

## 2023-02-09 DIAGNOSIS — Z853 Personal history of malignant neoplasm of breast: Secondary | ICD-10-CM | POA: Diagnosis not present

## 2023-02-09 DIAGNOSIS — R296 Repeated falls: Secondary | ICD-10-CM | POA: Diagnosis not present

## 2023-02-09 DIAGNOSIS — M810 Age-related osteoporosis without current pathological fracture: Secondary | ICD-10-CM | POA: Diagnosis not present

## 2023-02-09 DIAGNOSIS — I951 Orthostatic hypotension: Secondary | ICD-10-CM | POA: Diagnosis not present

## 2023-02-09 DIAGNOSIS — M1990 Unspecified osteoarthritis, unspecified site: Secondary | ICD-10-CM | POA: Diagnosis not present

## 2023-02-09 DIAGNOSIS — G4700 Insomnia, unspecified: Secondary | ICD-10-CM | POA: Diagnosis not present

## 2023-02-10 DIAGNOSIS — R296 Repeated falls: Secondary | ICD-10-CM | POA: Diagnosis not present

## 2023-02-10 DIAGNOSIS — G4700 Insomnia, unspecified: Secondary | ICD-10-CM | POA: Diagnosis not present

## 2023-02-10 DIAGNOSIS — I951 Orthostatic hypotension: Secondary | ICD-10-CM | POA: Diagnosis not present

## 2023-02-10 DIAGNOSIS — F410 Panic disorder [episodic paroxysmal anxiety] without agoraphobia: Secondary | ICD-10-CM | POA: Diagnosis not present

## 2023-02-10 DIAGNOSIS — M810 Age-related osteoporosis without current pathological fracture: Secondary | ICD-10-CM | POA: Diagnosis not present

## 2023-02-10 DIAGNOSIS — M1990 Unspecified osteoarthritis, unspecified site: Secondary | ICD-10-CM | POA: Diagnosis not present

## 2023-02-10 DIAGNOSIS — K219 Gastro-esophageal reflux disease without esophagitis: Secondary | ICD-10-CM | POA: Diagnosis not present

## 2023-02-10 DIAGNOSIS — Z853 Personal history of malignant neoplasm of breast: Secondary | ICD-10-CM | POA: Diagnosis not present

## 2023-02-10 DIAGNOSIS — G20A1 Parkinson's disease without dyskinesia, without mention of fluctuations: Secondary | ICD-10-CM | POA: Diagnosis not present

## 2023-02-14 DIAGNOSIS — M1990 Unspecified osteoarthritis, unspecified site: Secondary | ICD-10-CM | POA: Diagnosis not present

## 2023-02-14 DIAGNOSIS — K219 Gastro-esophageal reflux disease without esophagitis: Secondary | ICD-10-CM | POA: Diagnosis not present

## 2023-02-14 DIAGNOSIS — I951 Orthostatic hypotension: Secondary | ICD-10-CM | POA: Diagnosis not present

## 2023-02-14 DIAGNOSIS — G4700 Insomnia, unspecified: Secondary | ICD-10-CM | POA: Diagnosis not present

## 2023-02-14 DIAGNOSIS — F410 Panic disorder [episodic paroxysmal anxiety] without agoraphobia: Secondary | ICD-10-CM | POA: Diagnosis not present

## 2023-02-14 DIAGNOSIS — R296 Repeated falls: Secondary | ICD-10-CM | POA: Diagnosis not present

## 2023-02-14 DIAGNOSIS — G20A1 Parkinson's disease without dyskinesia, without mention of fluctuations: Secondary | ICD-10-CM | POA: Diagnosis not present

## 2023-02-14 DIAGNOSIS — M810 Age-related osteoporosis without current pathological fracture: Secondary | ICD-10-CM | POA: Diagnosis not present

## 2023-02-14 DIAGNOSIS — Z853 Personal history of malignant neoplasm of breast: Secondary | ICD-10-CM | POA: Diagnosis not present

## 2023-02-15 DIAGNOSIS — Z853 Personal history of malignant neoplasm of breast: Secondary | ICD-10-CM | POA: Diagnosis not present

## 2023-02-15 DIAGNOSIS — G20A1 Parkinson's disease without dyskinesia, without mention of fluctuations: Secondary | ICD-10-CM | POA: Diagnosis not present

## 2023-02-15 DIAGNOSIS — F410 Panic disorder [episodic paroxysmal anxiety] without agoraphobia: Secondary | ICD-10-CM | POA: Diagnosis not present

## 2023-02-15 DIAGNOSIS — I951 Orthostatic hypotension: Secondary | ICD-10-CM | POA: Diagnosis not present

## 2023-02-15 DIAGNOSIS — M810 Age-related osteoporosis without current pathological fracture: Secondary | ICD-10-CM | POA: Diagnosis not present

## 2023-02-15 DIAGNOSIS — M1990 Unspecified osteoarthritis, unspecified site: Secondary | ICD-10-CM | POA: Diagnosis not present

## 2023-02-15 DIAGNOSIS — G4700 Insomnia, unspecified: Secondary | ICD-10-CM | POA: Diagnosis not present

## 2023-02-15 DIAGNOSIS — K219 Gastro-esophageal reflux disease without esophagitis: Secondary | ICD-10-CM | POA: Diagnosis not present

## 2023-02-15 DIAGNOSIS — R296 Repeated falls: Secondary | ICD-10-CM | POA: Diagnosis not present

## 2023-02-17 DIAGNOSIS — R296 Repeated falls: Secondary | ICD-10-CM | POA: Diagnosis not present

## 2023-02-17 DIAGNOSIS — K219 Gastro-esophageal reflux disease without esophagitis: Secondary | ICD-10-CM | POA: Diagnosis not present

## 2023-02-17 DIAGNOSIS — I951 Orthostatic hypotension: Secondary | ICD-10-CM | POA: Diagnosis not present

## 2023-02-17 DIAGNOSIS — Z853 Personal history of malignant neoplasm of breast: Secondary | ICD-10-CM | POA: Diagnosis not present

## 2023-02-17 DIAGNOSIS — G4700 Insomnia, unspecified: Secondary | ICD-10-CM | POA: Diagnosis not present

## 2023-02-17 DIAGNOSIS — M810 Age-related osteoporosis without current pathological fracture: Secondary | ICD-10-CM | POA: Diagnosis not present

## 2023-02-17 DIAGNOSIS — G20A1 Parkinson's disease without dyskinesia, without mention of fluctuations: Secondary | ICD-10-CM | POA: Diagnosis not present

## 2023-02-17 DIAGNOSIS — F410 Panic disorder [episodic paroxysmal anxiety] without agoraphobia: Secondary | ICD-10-CM | POA: Diagnosis not present

## 2023-02-17 DIAGNOSIS — M1990 Unspecified osteoarthritis, unspecified site: Secondary | ICD-10-CM | POA: Diagnosis not present

## 2023-02-18 ENCOUNTER — Inpatient Hospital Stay: Payer: Medicare HMO | Admitting: Oncology

## 2023-02-21 DIAGNOSIS — G4700 Insomnia, unspecified: Secondary | ICD-10-CM | POA: Diagnosis not present

## 2023-02-21 DIAGNOSIS — G20A1 Parkinson's disease without dyskinesia, without mention of fluctuations: Secondary | ICD-10-CM | POA: Diagnosis not present

## 2023-02-21 DIAGNOSIS — I951 Orthostatic hypotension: Secondary | ICD-10-CM | POA: Diagnosis not present

## 2023-02-21 DIAGNOSIS — M1990 Unspecified osteoarthritis, unspecified site: Secondary | ICD-10-CM | POA: Diagnosis not present

## 2023-02-21 DIAGNOSIS — Z853 Personal history of malignant neoplasm of breast: Secondary | ICD-10-CM | POA: Diagnosis not present

## 2023-02-21 DIAGNOSIS — F410 Panic disorder [episodic paroxysmal anxiety] without agoraphobia: Secondary | ICD-10-CM | POA: Diagnosis not present

## 2023-02-21 DIAGNOSIS — M810 Age-related osteoporosis without current pathological fracture: Secondary | ICD-10-CM | POA: Diagnosis not present

## 2023-02-21 DIAGNOSIS — K219 Gastro-esophageal reflux disease without esophagitis: Secondary | ICD-10-CM | POA: Diagnosis not present

## 2023-02-21 DIAGNOSIS — R296 Repeated falls: Secondary | ICD-10-CM | POA: Diagnosis not present

## 2023-02-23 DIAGNOSIS — K219 Gastro-esophageal reflux disease without esophagitis: Secondary | ICD-10-CM | POA: Diagnosis not present

## 2023-02-23 DIAGNOSIS — M1990 Unspecified osteoarthritis, unspecified site: Secondary | ICD-10-CM | POA: Diagnosis not present

## 2023-02-23 DIAGNOSIS — F410 Panic disorder [episodic paroxysmal anxiety] without agoraphobia: Secondary | ICD-10-CM | POA: Diagnosis not present

## 2023-02-23 DIAGNOSIS — R296 Repeated falls: Secondary | ICD-10-CM | POA: Diagnosis not present

## 2023-02-23 DIAGNOSIS — Z853 Personal history of malignant neoplasm of breast: Secondary | ICD-10-CM | POA: Diagnosis not present

## 2023-02-23 DIAGNOSIS — I951 Orthostatic hypotension: Secondary | ICD-10-CM | POA: Diagnosis not present

## 2023-02-23 DIAGNOSIS — G20A1 Parkinson's disease without dyskinesia, without mention of fluctuations: Secondary | ICD-10-CM | POA: Diagnosis not present

## 2023-02-23 DIAGNOSIS — M810 Age-related osteoporosis without current pathological fracture: Secondary | ICD-10-CM | POA: Diagnosis not present

## 2023-02-23 DIAGNOSIS — G4700 Insomnia, unspecified: Secondary | ICD-10-CM | POA: Diagnosis not present

## 2023-02-24 DIAGNOSIS — M1990 Unspecified osteoarthritis, unspecified site: Secondary | ICD-10-CM | POA: Diagnosis not present

## 2023-02-24 DIAGNOSIS — G4700 Insomnia, unspecified: Secondary | ICD-10-CM | POA: Diagnosis not present

## 2023-02-24 DIAGNOSIS — F410 Panic disorder [episodic paroxysmal anxiety] without agoraphobia: Secondary | ICD-10-CM | POA: Diagnosis not present

## 2023-02-24 DIAGNOSIS — I951 Orthostatic hypotension: Secondary | ICD-10-CM | POA: Diagnosis not present

## 2023-02-24 DIAGNOSIS — R296 Repeated falls: Secondary | ICD-10-CM | POA: Diagnosis not present

## 2023-02-24 DIAGNOSIS — G20A1 Parkinson's disease without dyskinesia, without mention of fluctuations: Secondary | ICD-10-CM | POA: Diagnosis not present

## 2023-02-24 DIAGNOSIS — Z853 Personal history of malignant neoplasm of breast: Secondary | ICD-10-CM | POA: Diagnosis not present

## 2023-02-24 DIAGNOSIS — K219 Gastro-esophageal reflux disease without esophagitis: Secondary | ICD-10-CM | POA: Diagnosis not present

## 2023-02-24 DIAGNOSIS — M810 Age-related osteoporosis without current pathological fracture: Secondary | ICD-10-CM | POA: Diagnosis not present

## 2023-02-28 ENCOUNTER — Inpatient Hospital Stay: Payer: Medicare HMO | Attending: Oncology | Admitting: Oncology

## 2023-02-28 ENCOUNTER — Other Ambulatory Visit: Payer: Self-pay | Admitting: Oncology

## 2023-02-28 VITALS — BP 126/58 | HR 66 | Temp 97.9°F | Resp 14 | Ht 62.5 in | Wt 106.2 lb

## 2023-02-28 DIAGNOSIS — C50919 Malignant neoplasm of unspecified site of unspecified female breast: Secondary | ICD-10-CM | POA: Insufficient documentation

## 2023-02-28 DIAGNOSIS — M810 Age-related osteoporosis without current pathological fracture: Secondary | ICD-10-CM | POA: Insufficient documentation

## 2023-02-28 DIAGNOSIS — K219 Gastro-esophageal reflux disease without esophagitis: Secondary | ICD-10-CM | POA: Diagnosis not present

## 2023-02-28 DIAGNOSIS — F410 Panic disorder [episodic paroxysmal anxiety] without agoraphobia: Secondary | ICD-10-CM | POA: Diagnosis not present

## 2023-02-28 DIAGNOSIS — Z170 Estrogen receptor positive status [ER+]: Secondary | ICD-10-CM

## 2023-02-28 DIAGNOSIS — G20A1 Parkinson's disease without dyskinesia, without mention of fluctuations: Secondary | ICD-10-CM | POA: Diagnosis not present

## 2023-02-28 DIAGNOSIS — C50312 Malignant neoplasm of lower-inner quadrant of left female breast: Secondary | ICD-10-CM

## 2023-02-28 DIAGNOSIS — Z23 Encounter for immunization: Secondary | ICD-10-CM | POA: Diagnosis not present

## 2023-02-28 DIAGNOSIS — R296 Repeated falls: Secondary | ICD-10-CM | POA: Diagnosis not present

## 2023-02-28 DIAGNOSIS — I951 Orthostatic hypotension: Secondary | ICD-10-CM | POA: Diagnosis not present

## 2023-02-28 DIAGNOSIS — Z79811 Long term (current) use of aromatase inhibitors: Secondary | ICD-10-CM | POA: Diagnosis not present

## 2023-02-28 DIAGNOSIS — Z853 Personal history of malignant neoplasm of breast: Secondary | ICD-10-CM | POA: Diagnosis not present

## 2023-02-28 DIAGNOSIS — M1990 Unspecified osteoarthritis, unspecified site: Secondary | ICD-10-CM | POA: Diagnosis not present

## 2023-02-28 DIAGNOSIS — G4700 Insomnia, unspecified: Secondary | ICD-10-CM | POA: Diagnosis not present

## 2023-02-28 NOTE — Progress Notes (Signed)
[facility]  [address] [place],  [place]  [address] [phone]  Clinic Day:  02/28/2023  Referring physician: Marylen Ponto, MD  HISTORY OF PRESENT ILLNESS:  Mary Simpson is a 77 y.o. 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  She currently takes anastrozole for her adjuvant endocrine therapy.  She comes in today for routine follow-up.  Since her last visit, the patient has been doing well.  She denies having any particular changes in her breasts which concern her for early disease recurrence.  Of note, her annual mammogram in April 2024 showed no evidence of disease recurrence.  However, her bone density study showed severe osteoporosis with a T-score of -5.8.  PHYSICAL EXAM:  Blood pressure (!) 126/58, pulse 66, temperature 97.9 F (36.6 C), resp. rate 14, height 5' 2.5" (1.588 m), weight 106 lb 3.2 oz (48.2 kg), SpO2 97%. Wt Readings from Last 3 Encounters:  02/28/23 106 lb 3.2 oz (48.2 kg)  10/18/22 112 lb 11.2 oz (51.1 kg)  06/18/22 114 lb 3.2 oz (51.8 kg)   Body mass index is 19.11 kg/m. Performance status (ECOG): 1 - Symptomatic but completely ambulatory Physical Exam Constitutional:      Appearance: Normal appearance.  HENT:     Mouth/Throat:     Pharynx: Oropharynx is clear. No oropharyngeal exudate.  Cardiovascular:     Rate and Rhythm: Normal rate and regular rhythm.     Heart sounds: No murmur heard.    No friction rub. No gallop.  Pulmonary:     Breath sounds: Normal breath sounds.  Chest:  Breasts:    Right: No swelling, bleeding, inverted nipple, mass, nipple discharge or skin change.     Left: No swelling, bleeding, inverted nipple, mass, nipple discharge or skin change.  Abdominal:     General: Bowel sounds are normal. There is no distension.     Palpations: Abdomen is soft. There is no mass.     Tenderness: There is no abdominal  tenderness.  Musculoskeletal:        General: No tenderness.     Cervical back: Normal range of motion and neck supple.     Right lower leg: No edema.     Left lower leg: No edema.  Lymphadenopathy:     Cervical: No cervical adenopathy.     Right cervical: No superficial, deep or posterior cervical adenopathy.    Left cervical: No superficial, deep or posterior cervical adenopathy.     Upper Body:     Right upper body: No supraclavicular or axillary adenopathy.     Left upper body: No supraclavicular or axillary adenopathy.     Lower Body: No right inguinal adenopathy. No left inguinal adenopathy.  Skin:    Coloration: Skin is not jaundiced.     Findings: No lesion or rash.  Neurological:     General: No focal deficit present.     Mental Status: She is alert and oriented to person, place, and time. Mental status is at baseline.  Psychiatric:        Mood and Affect: Mood normal.        Behavior: Behavior normal.        Thought Content: Thought content normal.        Judgment: Judgment normal.    ASSESSMENT & PLAN:  A 77 y.o. 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  Although her mammogram earlier this year was normal, her breast exam was pertinent for a left breast mass.  Based upon her clinical breast exam today, I will arrange for her to undergo a diagnostic mammogram immediately to ensure she does not have any new breast pathology.  I will see her back in 3 weeks to go over her diagnostic mammogram results with her and their implications.  The patient understands all the plans discussed today and is in agreement with them.  Frantz Quattrone Kirby Funk, MD

## 2023-03-01 ENCOUNTER — Telehealth: Payer: Self-pay | Admitting: Oncology

## 2023-03-01 ENCOUNTER — Other Ambulatory Visit: Payer: Self-pay | Admitting: Oncology

## 2023-03-01 DIAGNOSIS — C50312 Malignant neoplasm of lower-inner quadrant of left female breast: Secondary | ICD-10-CM

## 2023-03-01 NOTE — Telephone Encounter (Signed)
Patient has been scheduled. Aware of appt date and time.   Scheduling Message Entered by Rennis Harding A on 02/28/2023 at  5:56 PM Priority: Routine <No visit type provided>  Department: [facility]  Provider:  Scheduling Notes:  Diagnostic left mammogram THIS WEEK  APPT 03-21-23

## 2023-03-02 DIAGNOSIS — K219 Gastro-esophageal reflux disease without esophagitis: Secondary | ICD-10-CM | POA: Diagnosis not present

## 2023-03-02 DIAGNOSIS — M810 Age-related osteoporosis without current pathological fracture: Secondary | ICD-10-CM | POA: Diagnosis not present

## 2023-03-02 DIAGNOSIS — G4700 Insomnia, unspecified: Secondary | ICD-10-CM | POA: Diagnosis not present

## 2023-03-02 DIAGNOSIS — Z853 Personal history of malignant neoplasm of breast: Secondary | ICD-10-CM | POA: Diagnosis not present

## 2023-03-02 DIAGNOSIS — I951 Orthostatic hypotension: Secondary | ICD-10-CM | POA: Diagnosis not present

## 2023-03-02 DIAGNOSIS — R296 Repeated falls: Secondary | ICD-10-CM | POA: Diagnosis not present

## 2023-03-02 DIAGNOSIS — M1990 Unspecified osteoarthritis, unspecified site: Secondary | ICD-10-CM | POA: Diagnosis not present

## 2023-03-02 DIAGNOSIS — G20A1 Parkinson's disease without dyskinesia, without mention of fluctuations: Secondary | ICD-10-CM | POA: Diagnosis not present

## 2023-03-02 DIAGNOSIS — F410 Panic disorder [episodic paroxysmal anxiety] without agoraphobia: Secondary | ICD-10-CM | POA: Diagnosis not present

## 2023-03-03 DIAGNOSIS — G20A1 Parkinson's disease without dyskinesia, without mention of fluctuations: Secondary | ICD-10-CM | POA: Diagnosis not present

## 2023-03-03 DIAGNOSIS — C50912 Malignant neoplasm of unspecified site of left female breast: Secondary | ICD-10-CM | POA: Diagnosis not present

## 2023-03-04 DIAGNOSIS — G4700 Insomnia, unspecified: Secondary | ICD-10-CM | POA: Diagnosis not present

## 2023-03-04 DIAGNOSIS — K219 Gastro-esophageal reflux disease without esophagitis: Secondary | ICD-10-CM | POA: Diagnosis not present

## 2023-03-04 DIAGNOSIS — G20A1 Parkinson's disease without dyskinesia, without mention of fluctuations: Secondary | ICD-10-CM | POA: Diagnosis not present

## 2023-03-04 DIAGNOSIS — R296 Repeated falls: Secondary | ICD-10-CM | POA: Diagnosis not present

## 2023-03-04 DIAGNOSIS — M1990 Unspecified osteoarthritis, unspecified site: Secondary | ICD-10-CM | POA: Diagnosis not present

## 2023-03-04 DIAGNOSIS — I951 Orthostatic hypotension: Secondary | ICD-10-CM | POA: Diagnosis not present

## 2023-03-04 DIAGNOSIS — Z853 Personal history of malignant neoplasm of breast: Secondary | ICD-10-CM | POA: Diagnosis not present

## 2023-03-04 DIAGNOSIS — M810 Age-related osteoporosis without current pathological fracture: Secondary | ICD-10-CM | POA: Diagnosis not present

## 2023-03-04 DIAGNOSIS — F410 Panic disorder [episodic paroxysmal anxiety] without agoraphobia: Secondary | ICD-10-CM | POA: Diagnosis not present

## 2023-03-08 DIAGNOSIS — G20A1 Parkinson's disease without dyskinesia, without mention of fluctuations: Secondary | ICD-10-CM | POA: Diagnosis not present

## 2023-03-08 DIAGNOSIS — R296 Repeated falls: Secondary | ICD-10-CM | POA: Diagnosis not present

## 2023-03-08 DIAGNOSIS — Z853 Personal history of malignant neoplasm of breast: Secondary | ICD-10-CM | POA: Diagnosis not present

## 2023-03-08 DIAGNOSIS — M810 Age-related osteoporosis without current pathological fracture: Secondary | ICD-10-CM | POA: Diagnosis not present

## 2023-03-08 DIAGNOSIS — I951 Orthostatic hypotension: Secondary | ICD-10-CM | POA: Diagnosis not present

## 2023-03-08 DIAGNOSIS — F410 Panic disorder [episodic paroxysmal anxiety] without agoraphobia: Secondary | ICD-10-CM | POA: Diagnosis not present

## 2023-03-08 DIAGNOSIS — G4700 Insomnia, unspecified: Secondary | ICD-10-CM | POA: Diagnosis not present

## 2023-03-08 DIAGNOSIS — M1990 Unspecified osteoarthritis, unspecified site: Secondary | ICD-10-CM | POA: Diagnosis not present

## 2023-03-08 DIAGNOSIS — K219 Gastro-esophageal reflux disease without esophagitis: Secondary | ICD-10-CM | POA: Diagnosis not present

## 2023-03-15 ENCOUNTER — Ambulatory Visit: Admission: RE | Admit: 2023-03-15 | Payer: Medicare HMO | Source: Ambulatory Visit

## 2023-03-15 ENCOUNTER — Ambulatory Visit
Admission: RE | Admit: 2023-03-15 | Discharge: 2023-03-15 | Disposition: A | Discharge status: Home / Self Care | Payer: Medicare HMO | Source: Ambulatory Visit | Attending: Oncology | Admitting: Oncology

## 2023-03-15 DIAGNOSIS — C50312 Malignant neoplasm of lower-inner quadrant of left female breast: Secondary | ICD-10-CM | POA: Diagnosis not present

## 2023-03-15 DIAGNOSIS — Z853 Personal history of malignant neoplasm of breast: Secondary | ICD-10-CM | POA: Diagnosis not present

## 2023-03-15 DIAGNOSIS — Z170 Estrogen receptor positive status [ER+]: Secondary | ICD-10-CM

## 2023-03-18 DIAGNOSIS — R309 Painful micturition, unspecified: Secondary | ICD-10-CM | POA: Diagnosis not present

## 2023-03-18 DIAGNOSIS — R3915 Urgency of urination: Secondary | ICD-10-CM | POA: Diagnosis not present

## 2023-03-18 DIAGNOSIS — Z6820 Body mass index (BMI) 20.0-20.9, adult: Secondary | ICD-10-CM | POA: Diagnosis not present

## 2023-03-18 DIAGNOSIS — R350 Frequency of micturition: Secondary | ICD-10-CM | POA: Diagnosis not present

## 2023-03-18 DIAGNOSIS — R300 Dysuria: Secondary | ICD-10-CM | POA: Diagnosis not present

## 2023-03-20 NOTE — Progress Notes (Deleted)
[facility]  [address] [place],  [place]  [address] [phone]  Clinic Day:  10/18/2022  Referring physician: Marylen Ponto, MD  HISTORY OF PRESENT ILLNESS:  Mary Simpson is a 77 y.o. 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  She currently takes anastrozole for her adjuvant endocrine therapy.  She comes in today for routine follow-up.  Since her last visit, the patient has been doing well.  She denies having any particular changes in her breasts which concern her for early disease recurrence.  Of note, her annual mammogram in April 2024 showed no evidence of disease recurrence.  However, her bone density study showed severe osteoporosis with a T-score of -5.8.  PHYSICAL EXAM:  There were no vitals taken for this visit. Wt Readings from Last 3 Encounters:  02/28/23 106 lb 3.2 oz (48.2 kg)  10/18/22 112 lb 11.2 oz (51.1 kg)  06/18/22 114 lb 3.2 oz (51.8 kg)   There is no height or weight on file to calculate BMI. Performance status (ECOG): 1 - Symptomatic but completely ambulatory Physical Exam Constitutional:      Appearance: Normal appearance.  HENT:     Mouth/Throat:     Pharynx: Oropharynx is clear. No oropharyngeal exudate.  Cardiovascular:     Rate and Rhythm: Normal rate and regular rhythm.     Heart sounds: No murmur heard.    No friction rub. No gallop.  Pulmonary:     Breath sounds: Normal breath sounds.  Chest:  Breasts:    Right: No swelling, bleeding, inverted nipple, mass, nipple discharge or skin change.     Left: No swelling, bleeding, inverted nipple, mass, nipple discharge or skin change.  Abdominal:     General: Bowel sounds are normal. There is no distension.     Palpations: Abdomen is soft. There is no mass.     Tenderness: There is no abdominal tenderness.  Musculoskeletal:        General: No tenderness.     Cervical back: Normal range  of motion and neck supple.     Right lower leg: No edema.     Left lower leg: No edema.  Lymphadenopathy:     Cervical: No cervical adenopathy.     Right cervical: No superficial, deep or posterior cervical adenopathy.    Left cervical: No superficial, deep or posterior cervical adenopathy.     Upper Body:     Right upper body: No supraclavicular or axillary adenopathy.     Left upper body: No supraclavicular or axillary adenopathy.     Lower Body: No right inguinal adenopathy. No left inguinal adenopathy.  Skin:    Coloration: Skin is not jaundiced.     Findings: No lesion or rash.  Neurological:     General: No focal deficit present.     Mental Status: She is alert and oriented to person, place, and time. Mental status is at baseline.  Psychiatric:        Mood and Affect: Mood normal.        Behavior: Behavior normal.        Thought Content: Thought content normal.        Judgment: Judgment normal.    ASSESSMENT & PLAN:   A 77 y.o. 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  Based upon her clinical  breast exam today and recent mammogram, the patient remains disease-free.  The patient knows to continue taking her anastrozole daily to complete 5 total years of endocrine therapy.  As she does have osteoporosis, she does continue taking her alendronate on a weekly basis.  I also encouraged her to take calcium/vitamin D on a daily basis to ensure optimization of better bone health.  Clinically, she is doing  okay.  I will see her back in 4 months for repeat clinical assessment.  The patient understands all the plans discussed today and is in agreement with them.  Brenon Antosh Kirby Funk, MD

## 2023-03-21 ENCOUNTER — Inpatient Hospital Stay: Payer: Medicare HMO | Attending: Oncology | Admitting: Oncology

## 2023-03-25 IMAGING — MG MM PLC BREAST LOC DEV 1ST LESION INC MAMMO GUIDE*L*
8 of 9 series · 8 of 9 positions shown · non-contrast
Comparison: None Available.

CLINICAL DATA: 76-year-old female with recently diagnosed complex
sclerosing lesion at site of ribbon shaped biopsy marking clip in
the lower inner left breast presents for preoperative radioactive
seed localization prior to planned excision.

EXAM:
MAMMOGRAPHIC GUIDED RADIOACTIVE SEED LOCALIZATION OF THE LEFT BREAST

[L ML (1 of 5)]
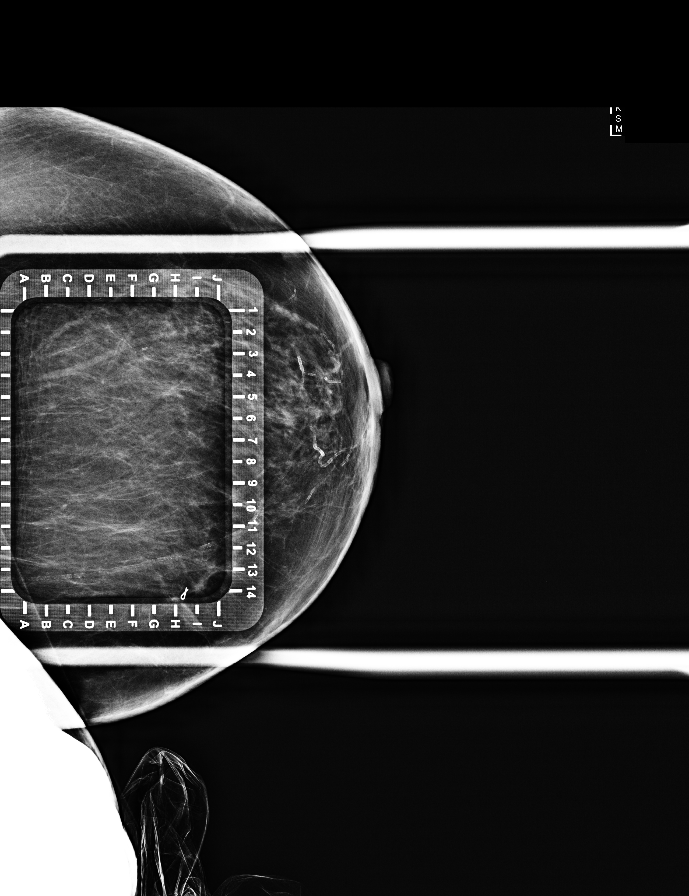

[L ML (2 of 5)]
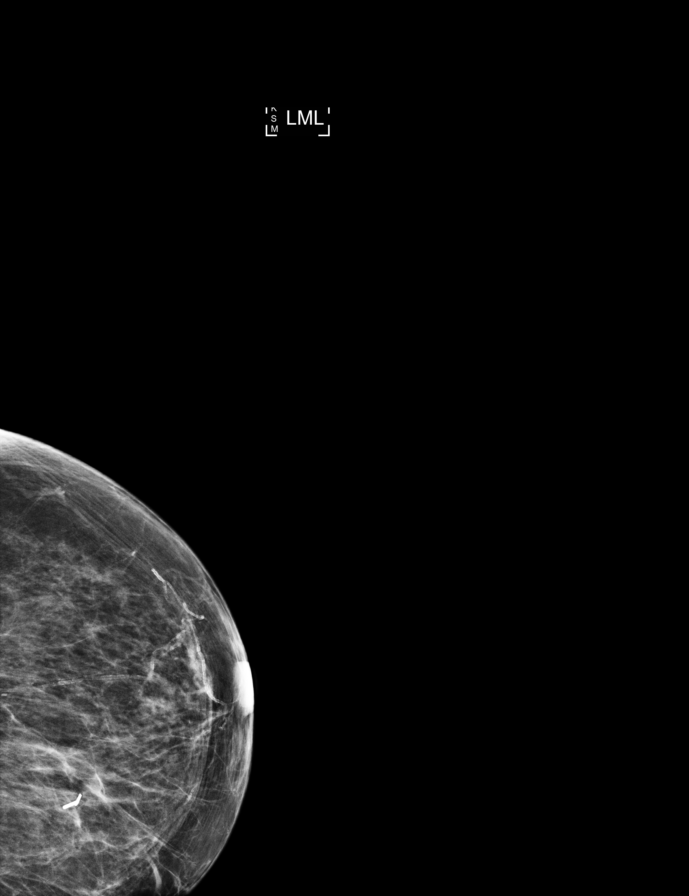

[L ML (3 of 5)]
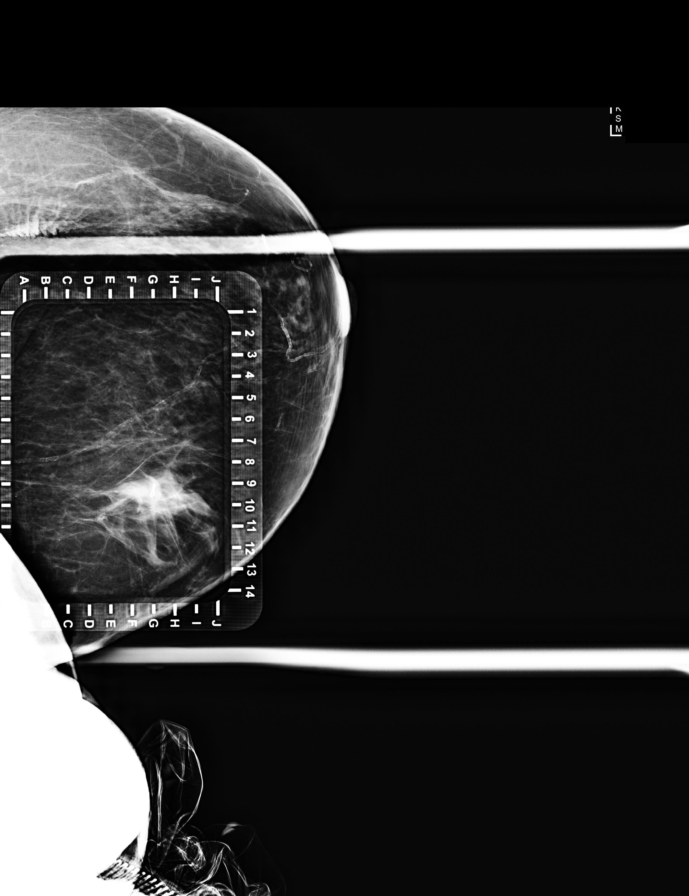

[L CC (1 of 3)]
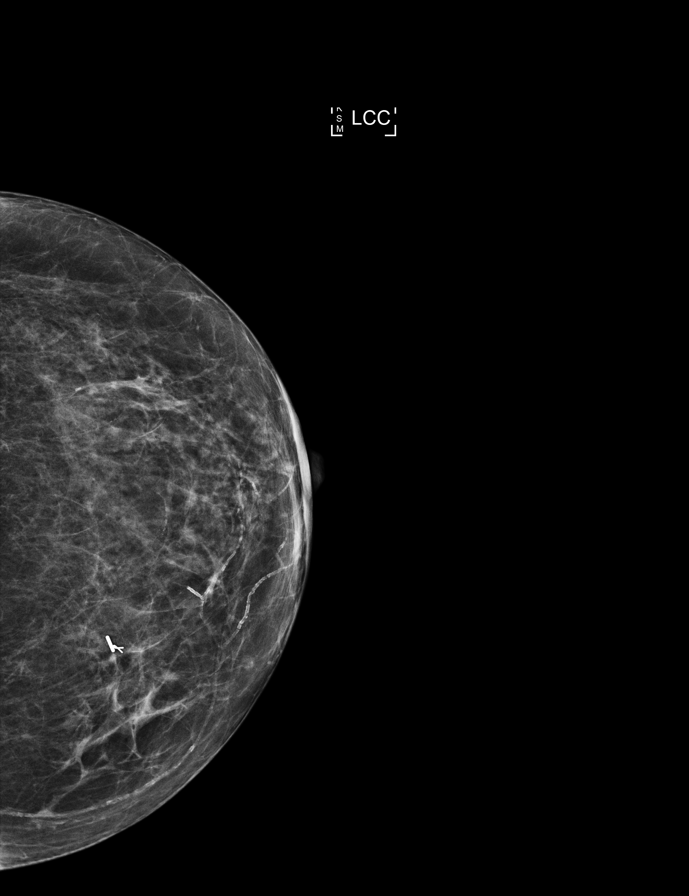

[L CC (2 of 3)]
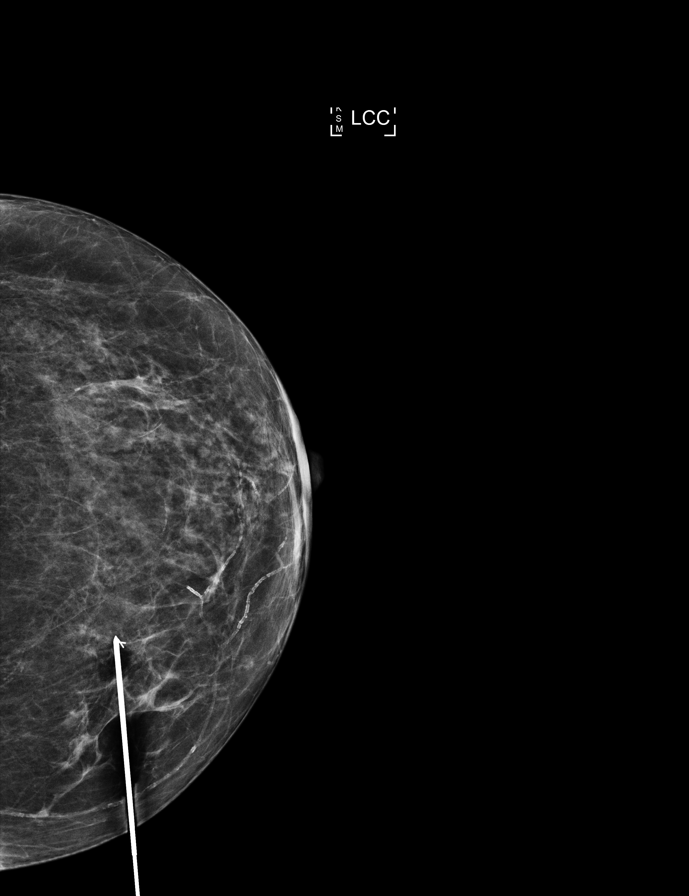

[L ML (4 of 5)]
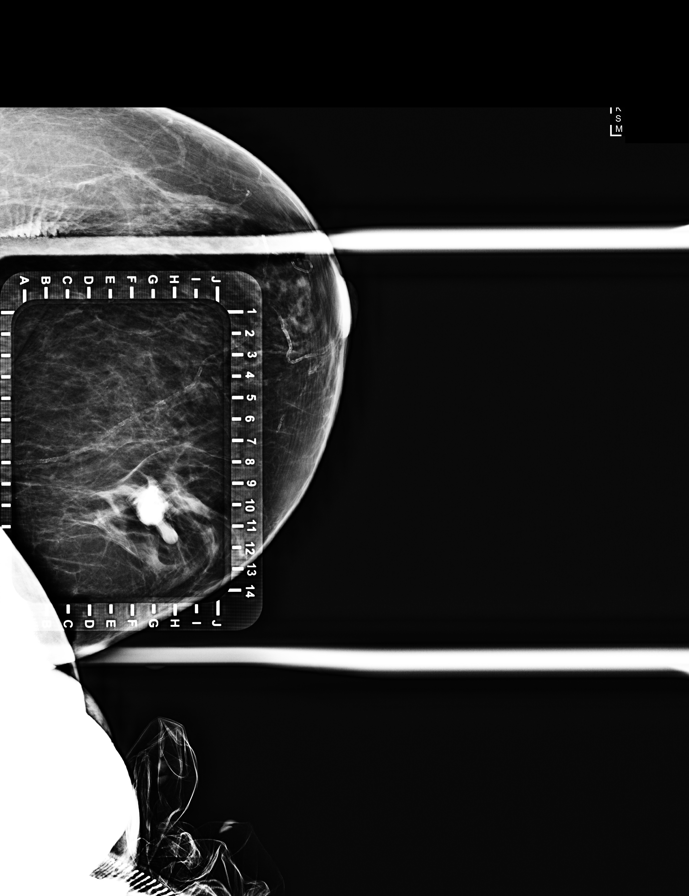

[L ML (5 of 5)]
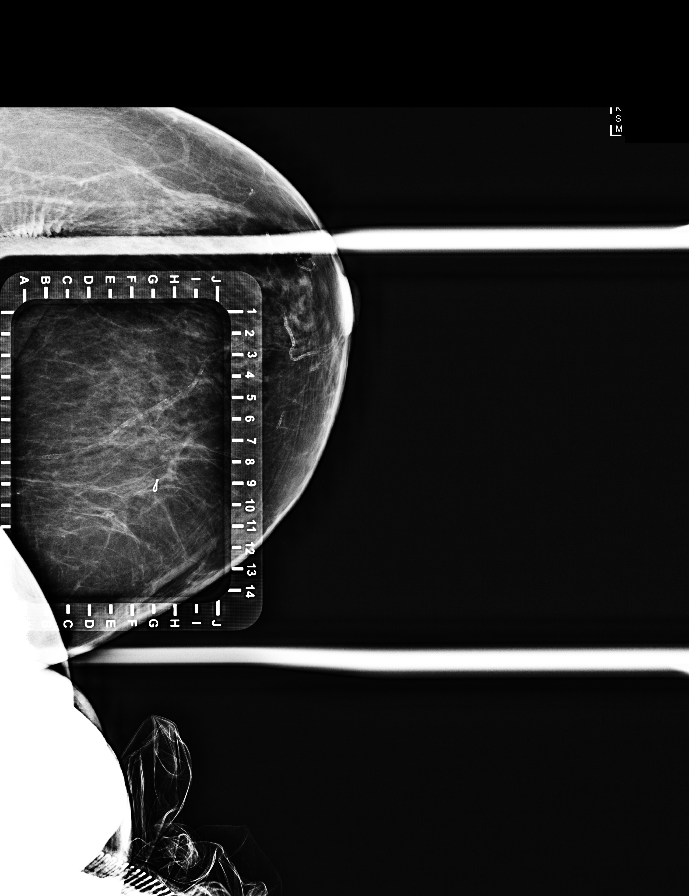

[L CC (3 of 3)]
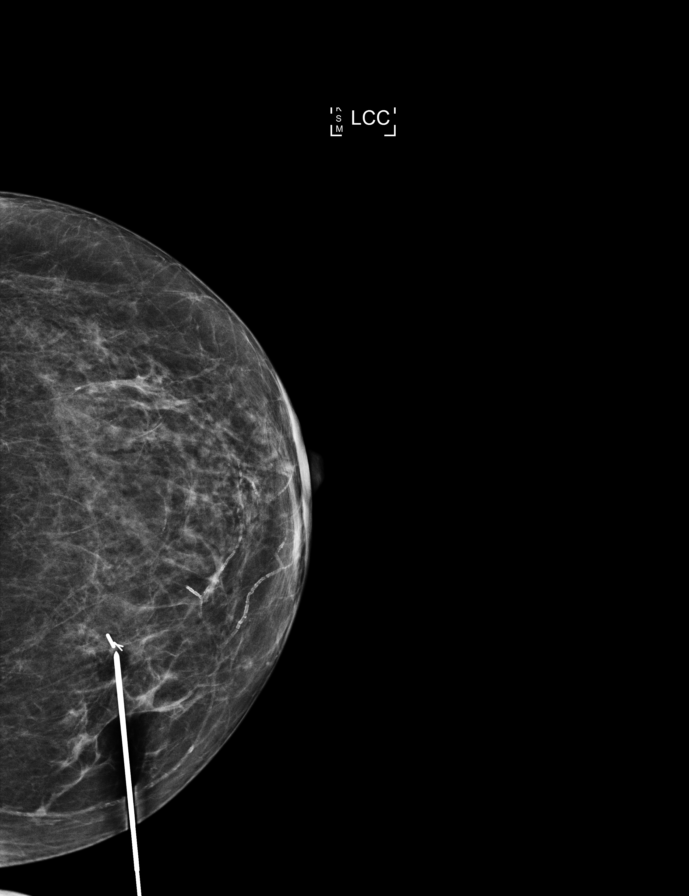

[8 of 9 positions shown; findings below may reference images not displayed]

FINDINGS: Patient presents for radioactive seed localization prior to left
breast excision. I met with the patient and we discussed the
procedure of seed localization including benefits and alternatives.
We discussed the high likelihood of a successful procedure. We
discussed the risks of the procedure including infection, bleeding,
tissue injury and further surgery. We discussed the low dose of
radioactivity involved in the procedure. Informed, written consent
was given.

The usual time-out protocol was performed immediately prior to the
procedure.

Using mammographic guidance, sterile technique, 1% lidocaine and an
[id number] radioactive seed, the ribbon shaped biopsy marking clip in the
lower inner left breast was localized using a medial to lateral
approach. The follow-up mammogram images confirm the seed in the
expected location and were marked for Dr. Semeu.

Follow-up survey of the patient confirms presence of the radioactive
seed.

Order number of [id number] seed:  [id number].

Total activity:  0.256 millicuries reference Date: 08/30/2021

The patient tolerated the procedure well and was released from the
[REDACTED]. She was given instructions regarding seed removal.
IMPRESSION: Radioactive seed localization left breast. No apparent
complications.

## 2023-03-26 IMAGING — DX MM BREAST SURGICAL SPECIMEN
1 series · 2 of 2 positions shown · non-contrast
Comparison: None Available.

CLINICAL DATA: Post left breast excision.

EXAM:
SPECIMEN RADIOGRAPH OF THE LEFT BREAST

[Series 1: specimen digital x-ray · left · 0.07mm/px · 2 of 2 slices shown]
[im 1/2]
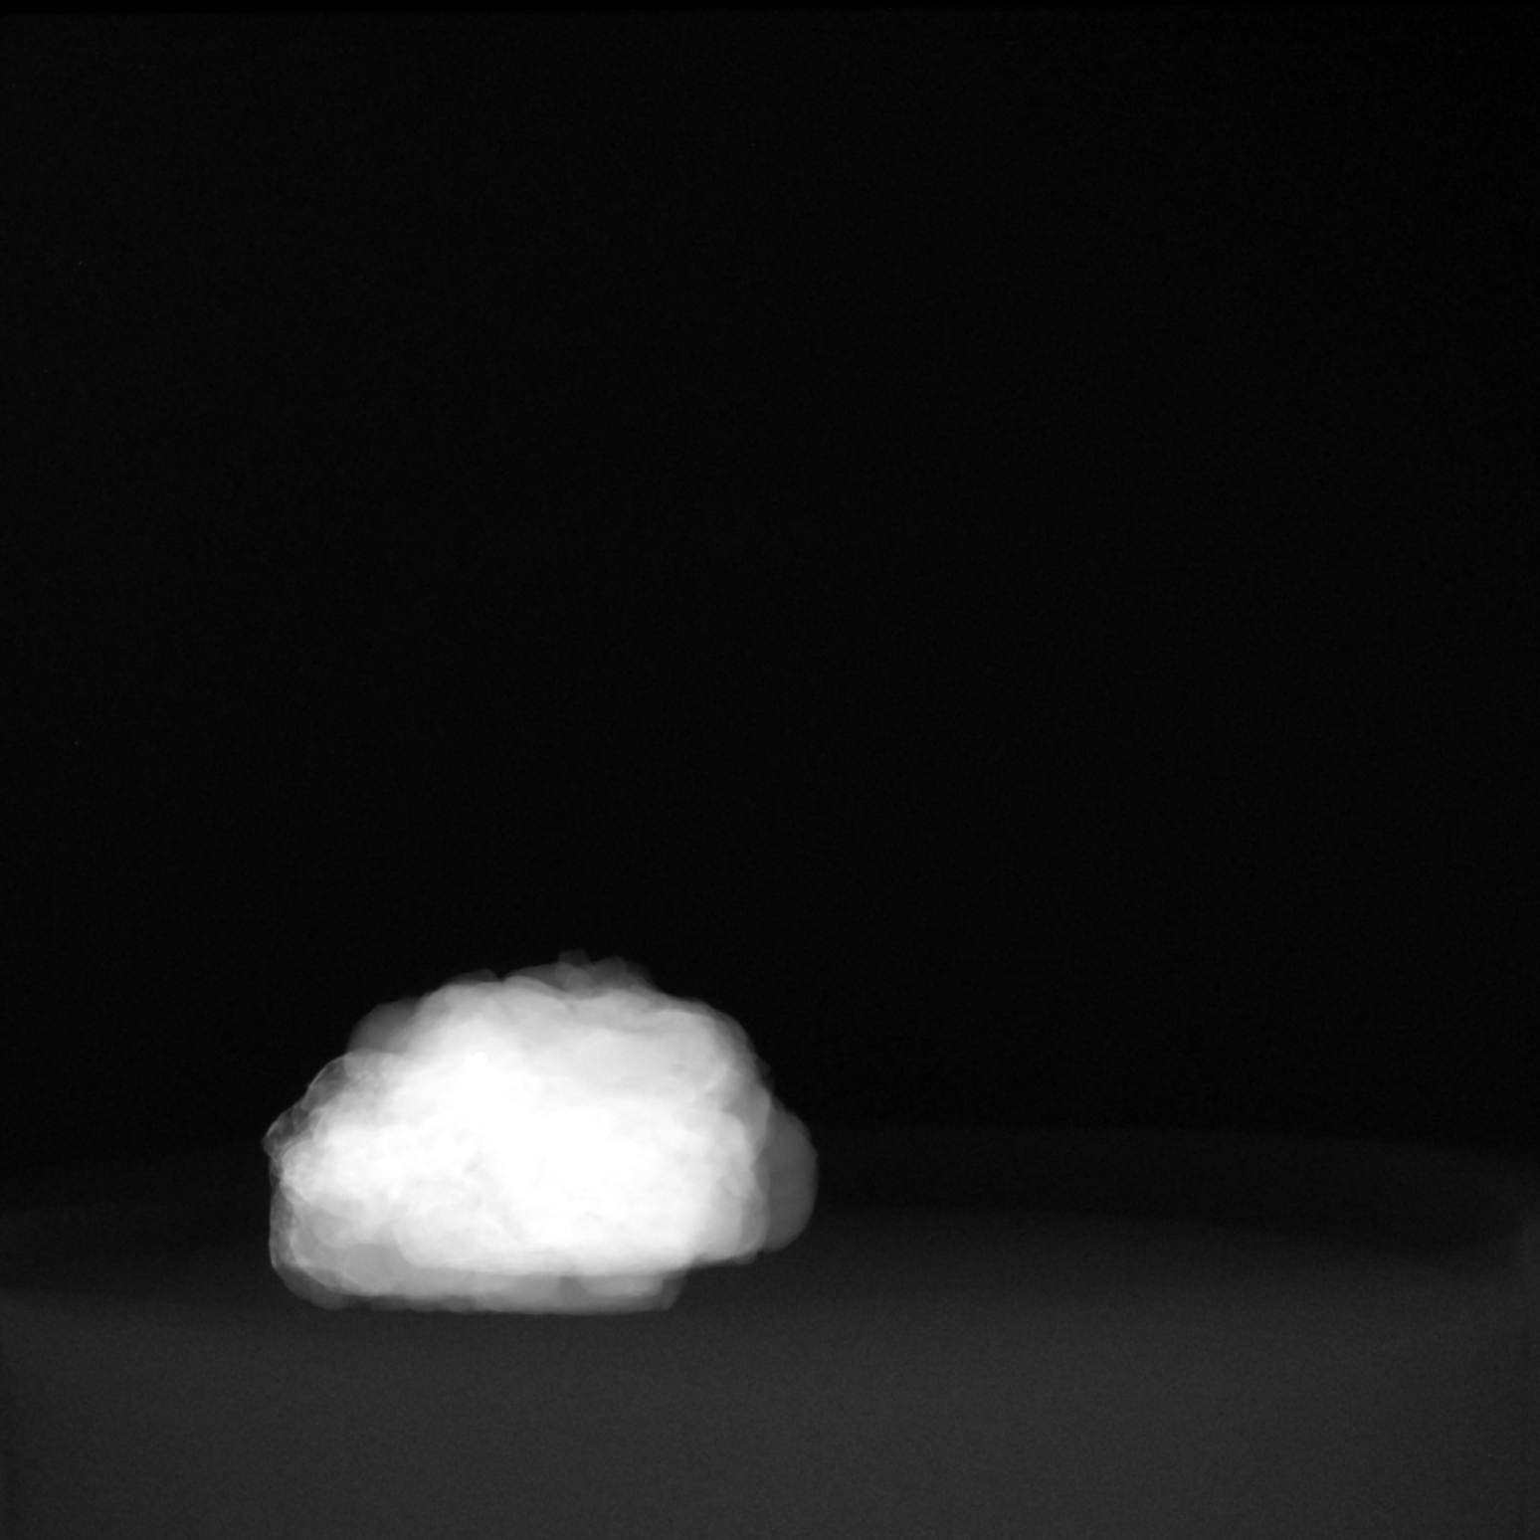
[im 2/2]
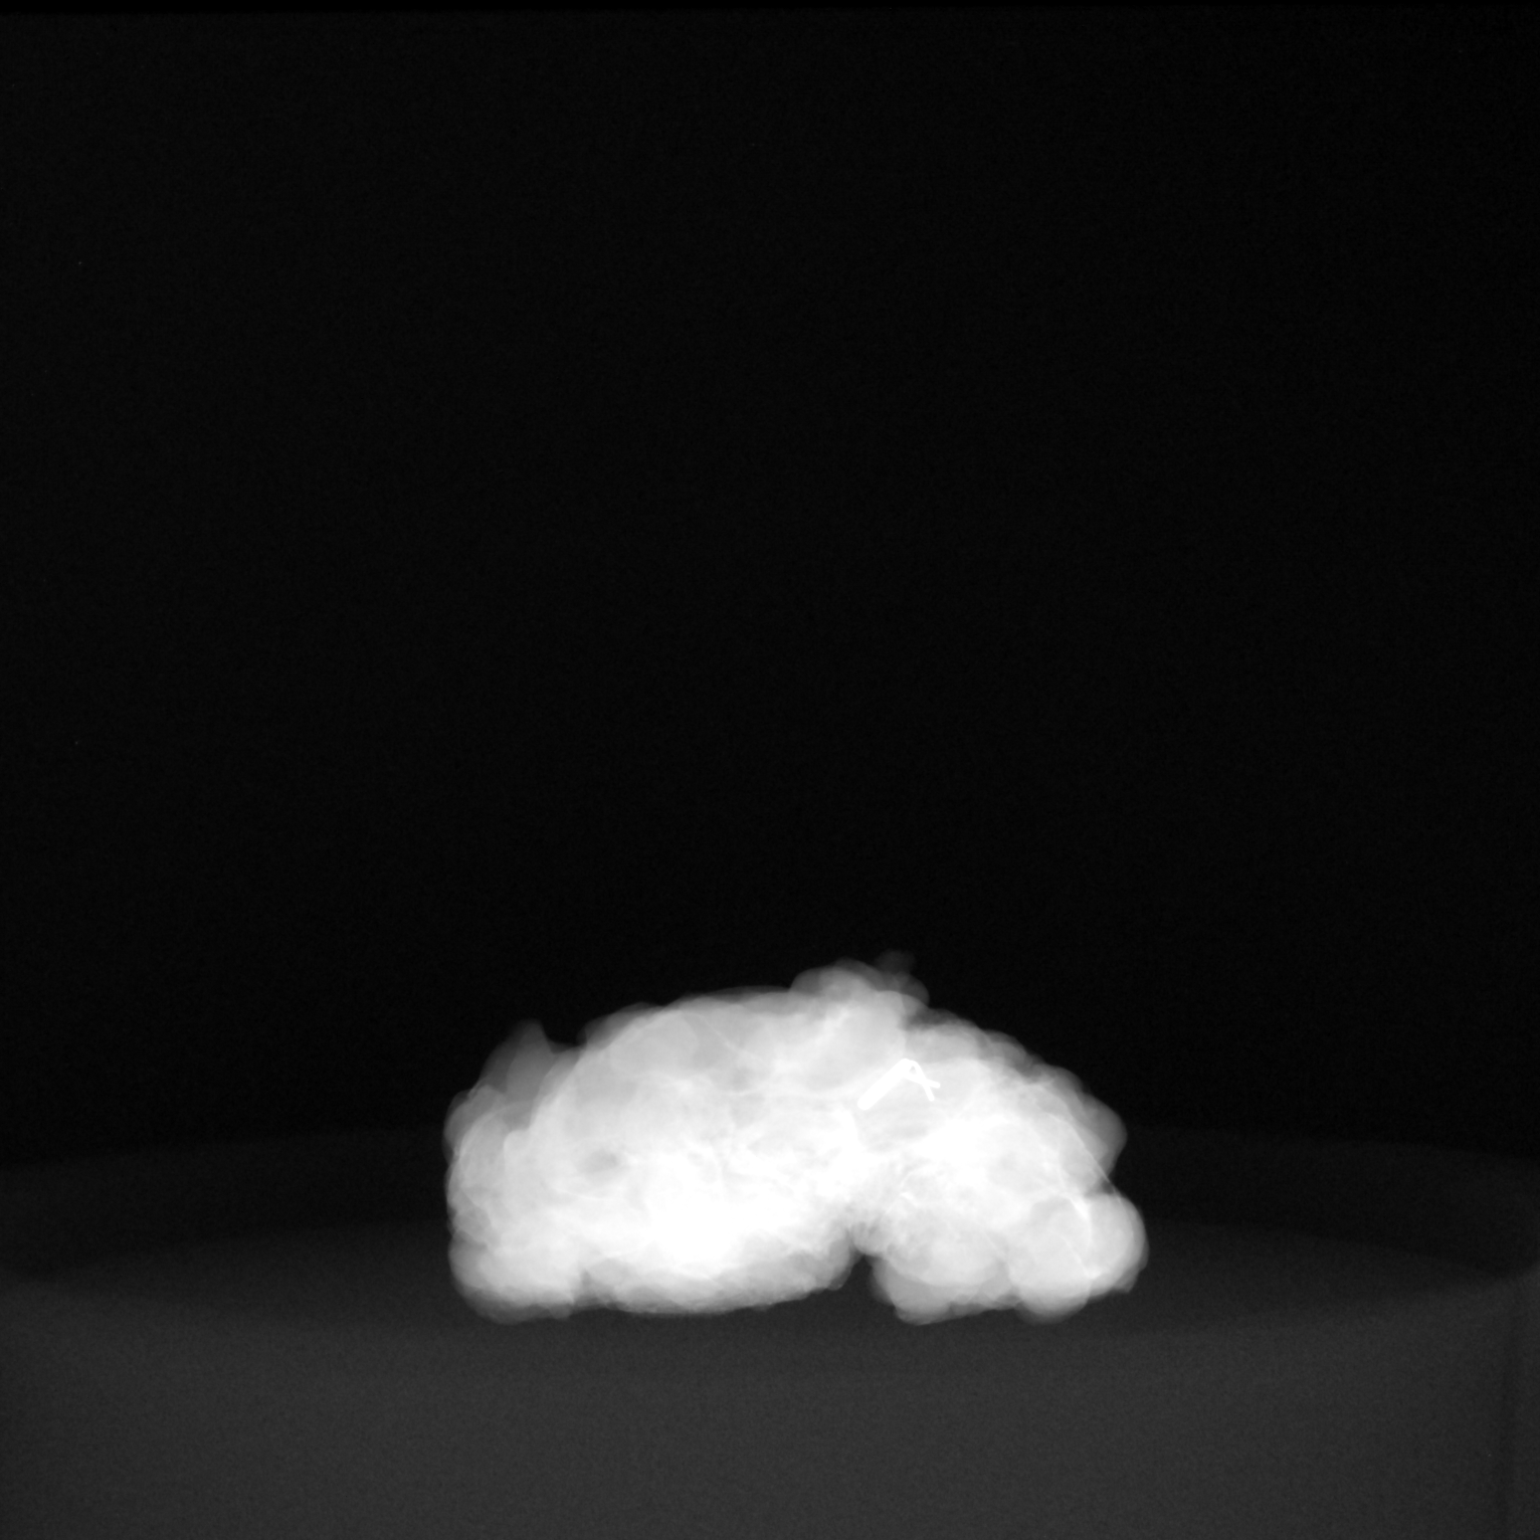

[2 of 2 positions shown; findings below may reference images not displayed]

FINDINGS: Status post excision of the left breast. The radioactive seed and
biopsy marker clip are present, completely intact, and were marked
for pathology.
IMPRESSION: Specimen radiograph of the left breast.

## 2023-03-30 DIAGNOSIS — G20A1 Parkinson's disease without dyskinesia, without mention of fluctuations: Secondary | ICD-10-CM | POA: Diagnosis not present

## 2023-04-26 DIAGNOSIS — Z79899 Other long term (current) drug therapy: Secondary | ICD-10-CM | POA: Diagnosis not present

## 2023-04-26 DIAGNOSIS — M25552 Pain in left hip: Secondary | ICD-10-CM | POA: Diagnosis not present

## 2023-04-26 DIAGNOSIS — S0990XA Unspecified injury of head, initial encounter: Secondary | ICD-10-CM | POA: Diagnosis not present

## 2023-04-26 DIAGNOSIS — R519 Headache, unspecified: Secondary | ICD-10-CM | POA: Diagnosis not present

## 2023-04-26 DIAGNOSIS — N3001 Acute cystitis with hematuria: Secondary | ICD-10-CM | POA: Diagnosis not present

## 2023-04-26 DIAGNOSIS — M545 Low back pain, unspecified: Secondary | ICD-10-CM | POA: Diagnosis not present

## 2023-04-26 DIAGNOSIS — R42 Dizziness and giddiness: Secondary | ICD-10-CM | POA: Diagnosis not present

## 2023-04-26 DIAGNOSIS — M549 Dorsalgia, unspecified: Secondary | ICD-10-CM | POA: Diagnosis not present

## 2023-04-26 DIAGNOSIS — M25519 Pain in unspecified shoulder: Secondary | ICD-10-CM | POA: Diagnosis not present

## 2023-04-26 DIAGNOSIS — R0602 Shortness of breath: Secondary | ICD-10-CM | POA: Diagnosis not present

## 2023-04-26 DIAGNOSIS — R911 Solitary pulmonary nodule: Secondary | ICD-10-CM | POA: Diagnosis not present

## 2023-04-26 DIAGNOSIS — R55 Syncope and collapse: Secondary | ICD-10-CM | POA: Diagnosis not present

## 2023-04-26 DIAGNOSIS — M25551 Pain in right hip: Secondary | ICD-10-CM | POA: Diagnosis not present

## 2023-05-08 DIAGNOSIS — N3000 Acute cystitis without hematuria: Secondary | ICD-10-CM | POA: Diagnosis not present

## 2023-05-08 DIAGNOSIS — N390 Urinary tract infection, site not specified: Secondary | ICD-10-CM | POA: Diagnosis not present

## 2023-05-08 DIAGNOSIS — R410 Disorientation, unspecified: Secondary | ICD-10-CM | POA: Diagnosis not present

## 2023-05-12 DIAGNOSIS — G4700 Insomnia, unspecified: Secondary | ICD-10-CM | POA: Diagnosis not present

## 2023-05-12 DIAGNOSIS — R300 Dysuria: Secondary | ICD-10-CM | POA: Diagnosis not present

## 2023-05-14 DIAGNOSIS — R41841 Cognitive communication deficit: Secondary | ICD-10-CM | POA: Diagnosis not present

## 2023-05-14 DIAGNOSIS — Z681 Body mass index (BMI) 19 or less, adult: Secondary | ICD-10-CM | POA: Diagnosis not present

## 2023-05-14 DIAGNOSIS — G20A1 Parkinson's disease without dyskinesia, without mention of fluctuations: Secondary | ICD-10-CM | POA: Diagnosis not present

## 2023-05-14 DIAGNOSIS — K219 Gastro-esophageal reflux disease without esophagitis: Secondary | ICD-10-CM | POA: Diagnosis not present

## 2023-05-14 DIAGNOSIS — F03B11 Unspecified dementia, moderate, with agitation: Secondary | ICD-10-CM | POA: Diagnosis not present

## 2023-05-14 DIAGNOSIS — I959 Hypotension, unspecified: Secondary | ICD-10-CM | POA: Diagnosis not present

## 2023-05-14 DIAGNOSIS — F419 Anxiety disorder, unspecified: Secondary | ICD-10-CM | POA: Diagnosis not present

## 2023-05-14 DIAGNOSIS — Z7401 Bed confinement status: Secondary | ICD-10-CM | POA: Diagnosis not present

## 2023-05-14 DIAGNOSIS — E440 Moderate protein-calorie malnutrition: Secondary | ICD-10-CM | POA: Diagnosis not present

## 2023-05-14 DIAGNOSIS — Z23 Encounter for immunization: Secondary | ICD-10-CM | POA: Diagnosis not present

## 2023-05-14 DIAGNOSIS — N390 Urinary tract infection, site not specified: Secondary | ICD-10-CM | POA: Diagnosis not present

## 2023-05-14 DIAGNOSIS — I480 Paroxysmal atrial fibrillation: Secondary | ICD-10-CM | POA: Diagnosis not present

## 2023-05-14 DIAGNOSIS — I4891 Unspecified atrial fibrillation: Secondary | ICD-10-CM | POA: Diagnosis not present

## 2023-05-14 DIAGNOSIS — R0602 Shortness of breath: Secondary | ICD-10-CM | POA: Diagnosis not present

## 2023-05-14 DIAGNOSIS — R000 Tachycardia, unspecified: Secondary | ICD-10-CM | POA: Diagnosis not present

## 2023-05-14 DIAGNOSIS — G4700 Insomnia, unspecified: Secondary | ICD-10-CM | POA: Diagnosis not present

## 2023-05-14 DIAGNOSIS — F02B18 Dementia in other diseases classified elsewhere, moderate, with other behavioral disturbance: Secondary | ICD-10-CM | POA: Diagnosis not present

## 2023-05-14 DIAGNOSIS — R079 Chest pain, unspecified: Secondary | ICD-10-CM | POA: Diagnosis not present

## 2023-05-14 DIAGNOSIS — G4489 Other headache syndrome: Secondary | ICD-10-CM | POA: Diagnosis not present

## 2023-05-14 DIAGNOSIS — R296 Repeated falls: Secondary | ICD-10-CM | POA: Diagnosis not present

## 2023-05-14 DIAGNOSIS — N3000 Acute cystitis without hematuria: Secondary | ICD-10-CM | POA: Diagnosis not present

## 2023-05-14 DIAGNOSIS — S0511XA Contusion of eyeball and orbital tissues, right eye, initial encounter: Secondary | ICD-10-CM | POA: Diagnosis not present

## 2023-05-23 DIAGNOSIS — F0390 Unspecified dementia without behavioral disturbance: Secondary | ICD-10-CM | POA: Diagnosis not present

## 2023-05-23 DIAGNOSIS — I4891 Unspecified atrial fibrillation: Secondary | ICD-10-CM | POA: Diagnosis not present

## 2023-05-23 DIAGNOSIS — K219 Gastro-esophageal reflux disease without esophagitis: Secondary | ICD-10-CM | POA: Diagnosis not present

## 2023-05-23 DIAGNOSIS — F02B18 Dementia in other diseases classified elsewhere, moderate, with other behavioral disturbance: Secondary | ICD-10-CM | POA: Diagnosis not present

## 2023-05-23 DIAGNOSIS — Z681 Body mass index (BMI) 19 or less, adult: Secondary | ICD-10-CM | POA: Diagnosis not present

## 2023-05-23 DIAGNOSIS — R079 Chest pain, unspecified: Secondary | ICD-10-CM | POA: Diagnosis not present

## 2023-05-23 DIAGNOSIS — Z7401 Bed confinement status: Secondary | ICD-10-CM | POA: Diagnosis not present

## 2023-05-23 DIAGNOSIS — G20C Parkinsonism, unspecified: Secondary | ICD-10-CM | POA: Diagnosis not present

## 2023-05-23 DIAGNOSIS — N3000 Acute cystitis without hematuria: Secondary | ICD-10-CM | POA: Diagnosis not present

## 2023-05-23 DIAGNOSIS — R41841 Cognitive communication deficit: Secondary | ICD-10-CM | POA: Diagnosis not present

## 2023-05-23 DIAGNOSIS — R531 Weakness: Secondary | ICD-10-CM | POA: Diagnosis not present

## 2023-05-23 DIAGNOSIS — E440 Moderate protein-calorie malnutrition: Secondary | ICD-10-CM | POA: Diagnosis not present

## 2023-05-23 DIAGNOSIS — Z23 Encounter for immunization: Secondary | ICD-10-CM | POA: Diagnosis not present

## 2023-05-23 DIAGNOSIS — I480 Paroxysmal atrial fibrillation: Secondary | ICD-10-CM | POA: Diagnosis not present

## 2023-05-23 DIAGNOSIS — N390 Urinary tract infection, site not specified: Secondary | ICD-10-CM | POA: Diagnosis not present

## 2023-05-23 DIAGNOSIS — G20A1 Parkinson's disease without dyskinesia, without mention of fluctuations: Secondary | ICD-10-CM | POA: Diagnosis not present

## 2023-05-23 DIAGNOSIS — I959 Hypotension, unspecified: Secondary | ICD-10-CM | POA: Diagnosis not present

## 2023-05-23 DIAGNOSIS — R296 Repeated falls: Secondary | ICD-10-CM | POA: Diagnosis not present

## 2023-05-23 DIAGNOSIS — F03B11 Unspecified dementia, moderate, with agitation: Secondary | ICD-10-CM | POA: Diagnosis not present

## 2023-05-23 DIAGNOSIS — G4700 Insomnia, unspecified: Secondary | ICD-10-CM | POA: Diagnosis not present

## 2023-05-24 DIAGNOSIS — N390 Urinary tract infection, site not specified: Secondary | ICD-10-CM | POA: Diagnosis not present

## 2023-05-24 DIAGNOSIS — R296 Repeated falls: Secondary | ICD-10-CM | POA: Diagnosis not present

## 2023-05-24 DIAGNOSIS — I4891 Unspecified atrial fibrillation: Secondary | ICD-10-CM | POA: Diagnosis not present

## 2023-05-24 DIAGNOSIS — R531 Weakness: Secondary | ICD-10-CM | POA: Diagnosis not present

## 2023-05-31 DIAGNOSIS — N3000 Acute cystitis without hematuria: Secondary | ICD-10-CM | POA: Diagnosis not present

## 2023-06-06 DIAGNOSIS — I4891 Unspecified atrial fibrillation: Secondary | ICD-10-CM | POA: Diagnosis not present

## 2023-06-06 DIAGNOSIS — G20C Parkinsonism, unspecified: Secondary | ICD-10-CM | POA: Diagnosis not present

## 2023-06-06 DIAGNOSIS — F0390 Unspecified dementia without behavioral disturbance: Secondary | ICD-10-CM | POA: Diagnosis not present

## 2023-06-06 DIAGNOSIS — R296 Repeated falls: Secondary | ICD-10-CM | POA: Diagnosis not present

## 2023-06-10 DIAGNOSIS — F02B18 Dementia in other diseases classified elsewhere, moderate, with other behavioral disturbance: Secondary | ICD-10-CM | POA: Diagnosis not present

## 2023-06-10 DIAGNOSIS — R32 Unspecified urinary incontinence: Secondary | ICD-10-CM | POA: Diagnosis not present

## 2023-06-10 DIAGNOSIS — R296 Repeated falls: Secondary | ICD-10-CM | POA: Diagnosis not present

## 2023-06-10 DIAGNOSIS — I480 Paroxysmal atrial fibrillation: Secondary | ICD-10-CM | POA: Diagnosis not present

## 2023-06-10 DIAGNOSIS — K219 Gastro-esophageal reflux disease without esophagitis: Secondary | ICD-10-CM | POA: Diagnosis not present

## 2023-06-10 DIAGNOSIS — F02B11 Dementia in other diseases classified elsewhere, moderate, with agitation: Secondary | ICD-10-CM | POA: Diagnosis not present

## 2023-06-10 DIAGNOSIS — G20A1 Parkinson's disease without dyskinesia, without mention of fluctuations: Secondary | ICD-10-CM | POA: Diagnosis not present

## 2023-06-10 DIAGNOSIS — G4700 Insomnia, unspecified: Secondary | ICD-10-CM | POA: Diagnosis not present

## 2023-06-10 DIAGNOSIS — I959 Hypotension, unspecified: Secondary | ICD-10-CM | POA: Diagnosis not present

## 2023-06-14 DIAGNOSIS — I959 Hypotension, unspecified: Secondary | ICD-10-CM | POA: Diagnosis not present

## 2023-06-14 DIAGNOSIS — F02B11 Dementia in other diseases classified elsewhere, moderate, with agitation: Secondary | ICD-10-CM | POA: Diagnosis not present

## 2023-06-14 DIAGNOSIS — R32 Unspecified urinary incontinence: Secondary | ICD-10-CM | POA: Diagnosis not present

## 2023-06-14 DIAGNOSIS — I480 Paroxysmal atrial fibrillation: Secondary | ICD-10-CM | POA: Diagnosis not present

## 2023-06-14 DIAGNOSIS — K219 Gastro-esophageal reflux disease without esophagitis: Secondary | ICD-10-CM | POA: Diagnosis not present

## 2023-06-14 DIAGNOSIS — G20A1 Parkinson's disease without dyskinesia, without mention of fluctuations: Secondary | ICD-10-CM | POA: Diagnosis not present

## 2023-06-14 DIAGNOSIS — F02B18 Dementia in other diseases classified elsewhere, moderate, with other behavioral disturbance: Secondary | ICD-10-CM | POA: Diagnosis not present

## 2023-06-14 DIAGNOSIS — R296 Repeated falls: Secondary | ICD-10-CM | POA: Diagnosis not present

## 2023-06-14 DIAGNOSIS — G4700 Insomnia, unspecified: Secondary | ICD-10-CM | POA: Diagnosis not present

## 2023-06-16 DIAGNOSIS — Z0000 Encounter for general adult medical examination without abnormal findings: Secondary | ICD-10-CM | POA: Diagnosis not present

## 2023-06-16 DIAGNOSIS — Z1339 Encounter for screening examination for other mental health and behavioral disorders: Secondary | ICD-10-CM | POA: Diagnosis not present

## 2023-06-16 DIAGNOSIS — R300 Dysuria: Secondary | ICD-10-CM | POA: Diagnosis not present

## 2023-06-16 DIAGNOSIS — Z1331 Encounter for screening for depression: Secondary | ICD-10-CM | POA: Diagnosis not present

## 2023-06-16 DIAGNOSIS — Z6820 Body mass index (BMI) 20.0-20.9, adult: Secondary | ICD-10-CM | POA: Diagnosis not present

## 2023-06-17 DIAGNOSIS — I480 Paroxysmal atrial fibrillation: Secondary | ICD-10-CM | POA: Diagnosis not present

## 2023-06-17 DIAGNOSIS — I959 Hypotension, unspecified: Secondary | ICD-10-CM | POA: Diagnosis not present

## 2023-06-17 DIAGNOSIS — F02B11 Dementia in other diseases classified elsewhere, moderate, with agitation: Secondary | ICD-10-CM | POA: Diagnosis not present

## 2023-06-17 DIAGNOSIS — G20A1 Parkinson's disease without dyskinesia, without mention of fluctuations: Secondary | ICD-10-CM | POA: Diagnosis not present

## 2023-06-17 DIAGNOSIS — F02B18 Dementia in other diseases classified elsewhere, moderate, with other behavioral disturbance: Secondary | ICD-10-CM | POA: Diagnosis not present

## 2023-06-17 DIAGNOSIS — R32 Unspecified urinary incontinence: Secondary | ICD-10-CM | POA: Diagnosis not present

## 2023-06-17 DIAGNOSIS — R296 Repeated falls: Secondary | ICD-10-CM | POA: Diagnosis not present

## 2023-06-17 DIAGNOSIS — K219 Gastro-esophageal reflux disease without esophagitis: Secondary | ICD-10-CM | POA: Diagnosis not present

## 2023-06-17 DIAGNOSIS — G4700 Insomnia, unspecified: Secondary | ICD-10-CM | POA: Diagnosis not present

## 2023-06-22 DIAGNOSIS — I480 Paroxysmal atrial fibrillation: Secondary | ICD-10-CM | POA: Diagnosis not present

## 2023-06-22 DIAGNOSIS — F02B11 Dementia in other diseases classified elsewhere, moderate, with agitation: Secondary | ICD-10-CM | POA: Diagnosis not present

## 2023-06-22 DIAGNOSIS — G20A1 Parkinson's disease without dyskinesia, without mention of fluctuations: Secondary | ICD-10-CM | POA: Diagnosis not present

## 2023-06-22 DIAGNOSIS — Z681 Body mass index (BMI) 19 or less, adult: Secondary | ICD-10-CM | POA: Diagnosis not present

## 2023-06-22 DIAGNOSIS — R296 Repeated falls: Secondary | ICD-10-CM | POA: Diagnosis not present

## 2023-06-22 DIAGNOSIS — K219 Gastro-esophageal reflux disease without esophagitis: Secondary | ICD-10-CM | POA: Diagnosis not present

## 2023-06-22 DIAGNOSIS — R32 Unspecified urinary incontinence: Secondary | ICD-10-CM | POA: Diagnosis not present

## 2023-06-22 DIAGNOSIS — F02B18 Dementia in other diseases classified elsewhere, moderate, with other behavioral disturbance: Secondary | ICD-10-CM | POA: Diagnosis not present

## 2023-06-22 DIAGNOSIS — N6320 Unspecified lump in the left breast, unspecified quadrant: Secondary | ICD-10-CM | POA: Diagnosis not present

## 2023-06-22 DIAGNOSIS — R300 Dysuria: Secondary | ICD-10-CM | POA: Diagnosis not present

## 2023-06-22 DIAGNOSIS — I959 Hypotension, unspecified: Secondary | ICD-10-CM | POA: Diagnosis not present

## 2023-06-22 DIAGNOSIS — G4700 Insomnia, unspecified: Secondary | ICD-10-CM | POA: Diagnosis not present

## 2023-06-23 DIAGNOSIS — G20A1 Parkinson's disease without dyskinesia, without mention of fluctuations: Secondary | ICD-10-CM | POA: Diagnosis not present

## 2023-06-23 DIAGNOSIS — R296 Repeated falls: Secondary | ICD-10-CM | POA: Diagnosis not present

## 2023-06-23 DIAGNOSIS — F02B18 Dementia in other diseases classified elsewhere, moderate, with other behavioral disturbance: Secondary | ICD-10-CM | POA: Diagnosis not present

## 2023-06-23 DIAGNOSIS — R32 Unspecified urinary incontinence: Secondary | ICD-10-CM | POA: Diagnosis not present

## 2023-06-23 DIAGNOSIS — F02B11 Dementia in other diseases classified elsewhere, moderate, with agitation: Secondary | ICD-10-CM | POA: Diagnosis not present

## 2023-06-23 DIAGNOSIS — I959 Hypotension, unspecified: Secondary | ICD-10-CM | POA: Diagnosis not present

## 2023-06-23 DIAGNOSIS — G4700 Insomnia, unspecified: Secondary | ICD-10-CM | POA: Diagnosis not present

## 2023-06-23 DIAGNOSIS — K219 Gastro-esophageal reflux disease without esophagitis: Secondary | ICD-10-CM | POA: Diagnosis not present

## 2023-06-23 DIAGNOSIS — I480 Paroxysmal atrial fibrillation: Secondary | ICD-10-CM | POA: Diagnosis not present

## 2023-06-28 DIAGNOSIS — F02B11 Dementia in other diseases classified elsewhere, moderate, with agitation: Secondary | ICD-10-CM | POA: Diagnosis not present

## 2023-06-28 DIAGNOSIS — R32 Unspecified urinary incontinence: Secondary | ICD-10-CM | POA: Diagnosis not present

## 2023-06-28 DIAGNOSIS — G4700 Insomnia, unspecified: Secondary | ICD-10-CM | POA: Diagnosis not present

## 2023-06-28 DIAGNOSIS — K219 Gastro-esophageal reflux disease without esophagitis: Secondary | ICD-10-CM | POA: Diagnosis not present

## 2023-06-28 DIAGNOSIS — G20A1 Parkinson's disease without dyskinesia, without mention of fluctuations: Secondary | ICD-10-CM | POA: Diagnosis not present

## 2023-06-28 DIAGNOSIS — I959 Hypotension, unspecified: Secondary | ICD-10-CM | POA: Diagnosis not present

## 2023-06-28 DIAGNOSIS — R296 Repeated falls: Secondary | ICD-10-CM | POA: Diagnosis not present

## 2023-06-28 DIAGNOSIS — F02B18 Dementia in other diseases classified elsewhere, moderate, with other behavioral disturbance: Secondary | ICD-10-CM | POA: Diagnosis not present

## 2023-06-28 DIAGNOSIS — I480 Paroxysmal atrial fibrillation: Secondary | ICD-10-CM | POA: Diagnosis not present

## 2023-06-30 DIAGNOSIS — I959 Hypotension, unspecified: Secondary | ICD-10-CM | POA: Diagnosis not present

## 2023-06-30 DIAGNOSIS — R32 Unspecified urinary incontinence: Secondary | ICD-10-CM | POA: Diagnosis not present

## 2023-06-30 DIAGNOSIS — G20A1 Parkinson's disease without dyskinesia, without mention of fluctuations: Secondary | ICD-10-CM | POA: Diagnosis not present

## 2023-06-30 DIAGNOSIS — R296 Repeated falls: Secondary | ICD-10-CM | POA: Diagnosis not present

## 2023-06-30 DIAGNOSIS — F02B18 Dementia in other diseases classified elsewhere, moderate, with other behavioral disturbance: Secondary | ICD-10-CM | POA: Diagnosis not present

## 2023-06-30 DIAGNOSIS — K219 Gastro-esophageal reflux disease without esophagitis: Secondary | ICD-10-CM | POA: Diagnosis not present

## 2023-06-30 DIAGNOSIS — G4700 Insomnia, unspecified: Secondary | ICD-10-CM | POA: Diagnosis not present

## 2023-06-30 DIAGNOSIS — I480 Paroxysmal atrial fibrillation: Secondary | ICD-10-CM | POA: Diagnosis not present

## 2023-06-30 DIAGNOSIS — F02B11 Dementia in other diseases classified elsewhere, moderate, with agitation: Secondary | ICD-10-CM | POA: Diagnosis not present

## 2023-07-04 DIAGNOSIS — R32 Unspecified urinary incontinence: Secondary | ICD-10-CM | POA: Diagnosis not present

## 2023-07-04 DIAGNOSIS — I480 Paroxysmal atrial fibrillation: Secondary | ICD-10-CM | POA: Diagnosis not present

## 2023-07-04 DIAGNOSIS — F02B11 Dementia in other diseases classified elsewhere, moderate, with agitation: Secondary | ICD-10-CM | POA: Diagnosis not present

## 2023-07-04 DIAGNOSIS — G20A1 Parkinson's disease without dyskinesia, without mention of fluctuations: Secondary | ICD-10-CM | POA: Diagnosis not present

## 2023-07-04 DIAGNOSIS — K219 Gastro-esophageal reflux disease without esophagitis: Secondary | ICD-10-CM | POA: Diagnosis not present

## 2023-07-04 DIAGNOSIS — R296 Repeated falls: Secondary | ICD-10-CM | POA: Diagnosis not present

## 2023-07-04 DIAGNOSIS — I959 Hypotension, unspecified: Secondary | ICD-10-CM | POA: Diagnosis not present

## 2023-07-04 DIAGNOSIS — G4700 Insomnia, unspecified: Secondary | ICD-10-CM | POA: Diagnosis not present

## 2023-07-04 DIAGNOSIS — F02B18 Dementia in other diseases classified elsewhere, moderate, with other behavioral disturbance: Secondary | ICD-10-CM | POA: Diagnosis not present

## 2023-07-06 DIAGNOSIS — R32 Unspecified urinary incontinence: Secondary | ICD-10-CM | POA: Diagnosis not present

## 2023-07-06 DIAGNOSIS — I480 Paroxysmal atrial fibrillation: Secondary | ICD-10-CM | POA: Diagnosis not present

## 2023-07-06 DIAGNOSIS — F02B11 Dementia in other diseases classified elsewhere, moderate, with agitation: Secondary | ICD-10-CM | POA: Diagnosis not present

## 2023-07-06 DIAGNOSIS — R296 Repeated falls: Secondary | ICD-10-CM | POA: Diagnosis not present

## 2023-07-06 DIAGNOSIS — F02B18 Dementia in other diseases classified elsewhere, moderate, with other behavioral disturbance: Secondary | ICD-10-CM | POA: Diagnosis not present

## 2023-07-06 DIAGNOSIS — K219 Gastro-esophageal reflux disease without esophagitis: Secondary | ICD-10-CM | POA: Diagnosis not present

## 2023-07-06 DIAGNOSIS — G4700 Insomnia, unspecified: Secondary | ICD-10-CM | POA: Diagnosis not present

## 2023-07-06 DIAGNOSIS — I959 Hypotension, unspecified: Secondary | ICD-10-CM | POA: Diagnosis not present

## 2023-07-06 DIAGNOSIS — G20A1 Parkinson's disease without dyskinesia, without mention of fluctuations: Secondary | ICD-10-CM | POA: Diagnosis not present

## 2023-07-08 DIAGNOSIS — I959 Hypotension, unspecified: Secondary | ICD-10-CM | POA: Diagnosis not present

## 2023-07-08 DIAGNOSIS — R32 Unspecified urinary incontinence: Secondary | ICD-10-CM | POA: Diagnosis not present

## 2023-07-08 DIAGNOSIS — F02B11 Dementia in other diseases classified elsewhere, moderate, with agitation: Secondary | ICD-10-CM | POA: Diagnosis not present

## 2023-07-08 DIAGNOSIS — F02B18 Dementia in other diseases classified elsewhere, moderate, with other behavioral disturbance: Secondary | ICD-10-CM | POA: Diagnosis not present

## 2023-07-08 DIAGNOSIS — G4700 Insomnia, unspecified: Secondary | ICD-10-CM | POA: Diagnosis not present

## 2023-07-08 DIAGNOSIS — R296 Repeated falls: Secondary | ICD-10-CM | POA: Diagnosis not present

## 2023-07-08 DIAGNOSIS — I480 Paroxysmal atrial fibrillation: Secondary | ICD-10-CM | POA: Diagnosis not present

## 2023-07-08 DIAGNOSIS — K219 Gastro-esophageal reflux disease without esophagitis: Secondary | ICD-10-CM | POA: Diagnosis not present

## 2023-07-08 DIAGNOSIS — G20A1 Parkinson's disease without dyskinesia, without mention of fluctuations: Secondary | ICD-10-CM | POA: Diagnosis not present

## 2023-07-10 DIAGNOSIS — K219 Gastro-esophageal reflux disease without esophagitis: Secondary | ICD-10-CM | POA: Diagnosis not present

## 2023-07-10 DIAGNOSIS — F02B11 Dementia in other diseases classified elsewhere, moderate, with agitation: Secondary | ICD-10-CM | POA: Diagnosis not present

## 2023-07-10 DIAGNOSIS — F02B18 Dementia in other diseases classified elsewhere, moderate, with other behavioral disturbance: Secondary | ICD-10-CM | POA: Diagnosis not present

## 2023-07-10 DIAGNOSIS — G4700 Insomnia, unspecified: Secondary | ICD-10-CM | POA: Diagnosis not present

## 2023-07-10 DIAGNOSIS — I959 Hypotension, unspecified: Secondary | ICD-10-CM | POA: Diagnosis not present

## 2023-07-10 DIAGNOSIS — I480 Paroxysmal atrial fibrillation: Secondary | ICD-10-CM | POA: Diagnosis not present

## 2023-07-10 DIAGNOSIS — R296 Repeated falls: Secondary | ICD-10-CM | POA: Diagnosis not present

## 2023-07-10 DIAGNOSIS — G20A1 Parkinson's disease without dyskinesia, without mention of fluctuations: Secondary | ICD-10-CM | POA: Diagnosis not present

## 2023-07-10 DIAGNOSIS — R32 Unspecified urinary incontinence: Secondary | ICD-10-CM | POA: Diagnosis not present

## 2023-07-13 DIAGNOSIS — F02B18 Dementia in other diseases classified elsewhere, moderate, with other behavioral disturbance: Secondary | ICD-10-CM | POA: Diagnosis not present

## 2023-07-13 DIAGNOSIS — R32 Unspecified urinary incontinence: Secondary | ICD-10-CM | POA: Diagnosis not present

## 2023-07-13 DIAGNOSIS — G4700 Insomnia, unspecified: Secondary | ICD-10-CM | POA: Diagnosis not present

## 2023-07-13 DIAGNOSIS — I959 Hypotension, unspecified: Secondary | ICD-10-CM | POA: Diagnosis not present

## 2023-07-13 DIAGNOSIS — R296 Repeated falls: Secondary | ICD-10-CM | POA: Diagnosis not present

## 2023-07-13 DIAGNOSIS — G20A1 Parkinson's disease without dyskinesia, without mention of fluctuations: Secondary | ICD-10-CM | POA: Diagnosis not present

## 2023-07-13 DIAGNOSIS — F02B11 Dementia in other diseases classified elsewhere, moderate, with agitation: Secondary | ICD-10-CM | POA: Diagnosis not present

## 2023-07-13 DIAGNOSIS — I480 Paroxysmal atrial fibrillation: Secondary | ICD-10-CM | POA: Diagnosis not present

## 2023-07-13 DIAGNOSIS — K219 Gastro-esophageal reflux disease without esophagitis: Secondary | ICD-10-CM | POA: Diagnosis not present

## 2023-07-14 DIAGNOSIS — I959 Hypotension, unspecified: Secondary | ICD-10-CM | POA: Diagnosis not present

## 2023-07-14 DIAGNOSIS — I480 Paroxysmal atrial fibrillation: Secondary | ICD-10-CM | POA: Diagnosis not present

## 2023-07-14 DIAGNOSIS — R32 Unspecified urinary incontinence: Secondary | ICD-10-CM | POA: Diagnosis not present

## 2023-07-14 DIAGNOSIS — G4700 Insomnia, unspecified: Secondary | ICD-10-CM | POA: Diagnosis not present

## 2023-07-14 DIAGNOSIS — K219 Gastro-esophageal reflux disease without esophagitis: Secondary | ICD-10-CM | POA: Diagnosis not present

## 2023-07-14 DIAGNOSIS — G20A1 Parkinson's disease without dyskinesia, without mention of fluctuations: Secondary | ICD-10-CM | POA: Diagnosis not present

## 2023-07-14 DIAGNOSIS — R296 Repeated falls: Secondary | ICD-10-CM | POA: Diagnosis not present

## 2023-07-14 DIAGNOSIS — F02B11 Dementia in other diseases classified elsewhere, moderate, with agitation: Secondary | ICD-10-CM | POA: Diagnosis not present

## 2023-07-14 DIAGNOSIS — F02B18 Dementia in other diseases classified elsewhere, moderate, with other behavioral disturbance: Secondary | ICD-10-CM | POA: Diagnosis not present

## 2023-07-18 DIAGNOSIS — K219 Gastro-esophageal reflux disease without esophagitis: Secondary | ICD-10-CM | POA: Diagnosis not present

## 2023-07-18 DIAGNOSIS — G4700 Insomnia, unspecified: Secondary | ICD-10-CM | POA: Diagnosis not present

## 2023-07-18 DIAGNOSIS — R296 Repeated falls: Secondary | ICD-10-CM | POA: Diagnosis not present

## 2023-07-18 DIAGNOSIS — G20A1 Parkinson's disease without dyskinesia, without mention of fluctuations: Secondary | ICD-10-CM | POA: Diagnosis not present

## 2023-07-18 DIAGNOSIS — F02B11 Dementia in other diseases classified elsewhere, moderate, with agitation: Secondary | ICD-10-CM | POA: Diagnosis not present

## 2023-07-18 DIAGNOSIS — I959 Hypotension, unspecified: Secondary | ICD-10-CM | POA: Diagnosis not present

## 2023-07-18 DIAGNOSIS — I480 Paroxysmal atrial fibrillation: Secondary | ICD-10-CM | POA: Diagnosis not present

## 2023-07-18 DIAGNOSIS — R32 Unspecified urinary incontinence: Secondary | ICD-10-CM | POA: Diagnosis not present

## 2023-07-18 DIAGNOSIS — F02B18 Dementia in other diseases classified elsewhere, moderate, with other behavioral disturbance: Secondary | ICD-10-CM | POA: Diagnosis not present

## 2023-07-26 DIAGNOSIS — I959 Hypotension, unspecified: Secondary | ICD-10-CM | POA: Diagnosis not present

## 2023-07-26 DIAGNOSIS — G4700 Insomnia, unspecified: Secondary | ICD-10-CM | POA: Diagnosis not present

## 2023-07-26 DIAGNOSIS — G20A1 Parkinson's disease without dyskinesia, without mention of fluctuations: Secondary | ICD-10-CM | POA: Diagnosis not present

## 2023-07-26 DIAGNOSIS — K219 Gastro-esophageal reflux disease without esophagitis: Secondary | ICD-10-CM | POA: Diagnosis not present

## 2023-07-26 DIAGNOSIS — F02B11 Dementia in other diseases classified elsewhere, moderate, with agitation: Secondary | ICD-10-CM | POA: Diagnosis not present

## 2023-07-26 DIAGNOSIS — R32 Unspecified urinary incontinence: Secondary | ICD-10-CM | POA: Diagnosis not present

## 2023-07-26 DIAGNOSIS — R296 Repeated falls: Secondary | ICD-10-CM | POA: Diagnosis not present

## 2023-07-26 DIAGNOSIS — F02B18 Dementia in other diseases classified elsewhere, moderate, with other behavioral disturbance: Secondary | ICD-10-CM | POA: Diagnosis not present

## 2023-07-26 DIAGNOSIS — I480 Paroxysmal atrial fibrillation: Secondary | ICD-10-CM | POA: Diagnosis not present

## 2023-08-23 DIAGNOSIS — R928 Other abnormal and inconclusive findings on diagnostic imaging of breast: Secondary | ICD-10-CM | POA: Diagnosis not present

## 2023-08-23 DIAGNOSIS — N6325 Unspecified lump in the left breast, overlapping quadrants: Secondary | ICD-10-CM | POA: Diagnosis not present

## 2023-08-23 DIAGNOSIS — R92322 Mammographic fibroglandular density, left breast: Secondary | ICD-10-CM | POA: Diagnosis not present

## 2023-08-23 DIAGNOSIS — R92323 Mammographic fibroglandular density, bilateral breasts: Secondary | ICD-10-CM | POA: Diagnosis not present

## 2023-09-08 DIAGNOSIS — H25813 Combined forms of age-related cataract, bilateral: Secondary | ICD-10-CM | POA: Diagnosis not present

## 2023-09-13 DIAGNOSIS — Z681 Body mass index (BMI) 19 or less, adult: Secondary | ICD-10-CM | POA: Diagnosis not present

## 2023-09-13 DIAGNOSIS — G20A1 Parkinson's disease without dyskinesia, without mention of fluctuations: Secondary | ICD-10-CM | POA: Diagnosis not present

## 2023-09-26 DIAGNOSIS — R300 Dysuria: Secondary | ICD-10-CM | POA: Diagnosis not present

## 2023-11-08 DIAGNOSIS — N390 Urinary tract infection, site not specified: Secondary | ICD-10-CM | POA: Diagnosis not present

## 2023-11-08 DIAGNOSIS — R300 Dysuria: Secondary | ICD-10-CM | POA: Diagnosis not present

## 2024-02-02 ENCOUNTER — Other Ambulatory Visit: Payer: Self-pay

## 2024-02-05 NOTE — Progress Notes (Unsigned)
 [facility]  [address] [place],  [place]  [address] [phone]  Clinic Day:  02/06/2024  Referring physician: Ina Marcellus RAMAN, MD  HISTORY OF PRESENT ILLNESS:  Mary Simpson is a 78 y.o. 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  She currently takes anastrozole  for her adjuvant endocrine therapy.  She comes in today for routine follow-up.  Since her last visit, the patient has been doing well.  She denies having any particular changes in her breasts which concern her for early disease recurrence.  Of note, a diagnostic mammogram in April 2025 showed no evidence of disease recurrence.  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  PHYSICAL EXAM:  Blood pressure 138/71, pulse 63, temperature 97.6 F (36.4 C), temperature source Oral, resp. rate 14, height 5' 2.5 (1.588 m), weight 93 lb 14.4 oz (42.6 kg), SpO2 100%. Wt Readings from Last 3 Encounters:  02/06/24 93 lb 14.4 oz (42.6 kg)  02/28/23 106 lb 3.2 oz (48.2 kg)  10/18/22 112 lb 11.2 oz (51.1 kg)   Body mass index is 16.9 kg/m. Performance status (ECOG): 1 - Symptomatic but completely ambulatory Physical Exam Constitutional:      Appearance: Normal appearance.  HENT:     Mouth/Throat:     Pharynx: Oropharynx is clear. No oropharyngeal exudate.  Cardiovascular:     Rate and Rhythm: Normal rate and regular rhythm.     Heart sounds: No murmur heard.    No friction rub. No gallop.  Pulmonary:     Breath sounds: Normal breath sounds.  Chest:  Breasts:    Right: No swelling, bleeding, inverted nipple, mass, nipple discharge or skin change.     Left: Mass (5 cm mass under her lumpectomy scar) present. No swelling, bleeding, inverted nipple, nipple discharge or skin change.  Abdominal:     General: Bowel sounds are normal. There is no distension.     Palpations:  Abdomen is soft. There is no mass.     Tenderness: There is no abdominal tenderness.  Musculoskeletal:        General: No tenderness.     Cervical back: Normal range of motion and neck supple.     Right lower leg: No edema.     Left lower leg: No edema.  Lymphadenopathy:     Cervical: No cervical adenopathy.     Right cervical: No superficial, deep or posterior cervical adenopathy.    Left cervical: No superficial, deep or posterior cervical adenopathy.     Upper Body:     Right upper body: No supraclavicular or axillary adenopathy.     Left upper body: No supraclavicular or axillary adenopathy.     Lower Body: No right inguinal adenopathy. No left inguinal adenopathy.  Skin:    Coloration: Skin is not jaundiced.     Findings: No lesion or rash.  Neurological:     General: No focal deficit present.     Mental Status: She is alert and oriented to person, place, and time. Mental status is at baseline.  Psychiatric:        Mood and Affect: Mood normal.        Behavior: Behavior normal.        Thought Content: Thought content normal.        Judgment: Judgment normal.    ASSESSMENT & PLAN:  A  78 y.o. 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  Although her mammogram earlier this year was normal, her breast exam was still pertinent for a left breast mass.  Her mammogram earlier this year showed postlumpectomy changes.  Nevertheless, I will order a diagnostic left mammogram in November 2025.  Although it likely represents postlumpectomy changes, the fact that I continue to feel a prominent breast mass has me wanting to reevaluate this lesion in the near future.  Overall, I do believe she is doing well.  I will see her back in 6 months for repeat clinical assessment.  The patient understands all the plans discussed today and is in agreement with them.  Ross Hefferan DELENA Kerns, MD

## 2024-02-06 ENCOUNTER — Inpatient Hospital Stay: Attending: Oncology | Admitting: Oncology

## 2024-02-06 ENCOUNTER — Other Ambulatory Visit: Payer: Self-pay | Admitting: Oncology

## 2024-02-06 VITALS — BP 138/71 | HR 63 | Temp 97.6°F | Resp 14 | Ht 62.5 in | Wt 93.9 lb

## 2024-02-06 DIAGNOSIS — Z79811 Long term (current) use of aromatase inhibitors: Secondary | ICD-10-CM | POA: Insufficient documentation

## 2024-02-06 DIAGNOSIS — Z170 Estrogen receptor positive status [ER+]: Secondary | ICD-10-CM | POA: Insufficient documentation

## 2024-02-06 DIAGNOSIS — C50912 Malignant neoplasm of unspecified site of left female breast: Secondary | ICD-10-CM | POA: Insufficient documentation

## 2024-02-06 DIAGNOSIS — C50312 Malignant neoplasm of lower-inner quadrant of left female breast: Secondary | ICD-10-CM | POA: Diagnosis not present

## 2024-02-06 DIAGNOSIS — M810 Age-related osteoporosis without current pathological fracture: Secondary | ICD-10-CM | POA: Insufficient documentation

## 2024-02-06 NOTE — Progress Notes (Signed)
 Patient c/o having to wear bra at night due to left breast mass causing pain.  Has been diagnosed with Parkinson's Dx since last visit per patient.

## 2024-02-22 DIAGNOSIS — R92322 Mammographic fibroglandular density, left breast: Secondary | ICD-10-CM | POA: Diagnosis not present

## 2024-02-22 DIAGNOSIS — C50312 Malignant neoplasm of lower-inner quadrant of left female breast: Secondary | ICD-10-CM | POA: Diagnosis not present

## 2024-03-06 ENCOUNTER — Encounter: Payer: Self-pay | Admitting: Oncology

## 2024-03-07 DIAGNOSIS — M25552 Pain in left hip: Secondary | ICD-10-CM | POA: Diagnosis not present

## 2024-03-07 DIAGNOSIS — S199XXA Unspecified injury of neck, initial encounter: Secondary | ICD-10-CM | POA: Diagnosis not present

## 2024-03-07 DIAGNOSIS — Z043 Encounter for examination and observation following other accident: Secondary | ICD-10-CM | POA: Diagnosis not present

## 2024-03-07 DIAGNOSIS — S299XXA Unspecified injury of thorax, initial encounter: Secondary | ICD-10-CM | POA: Diagnosis not present

## 2024-03-07 DIAGNOSIS — N390 Urinary tract infection, site not specified: Secondary | ICD-10-CM | POA: Diagnosis not present

## 2024-03-07 DIAGNOSIS — S0990XA Unspecified injury of head, initial encounter: Secondary | ICD-10-CM | POA: Diagnosis not present

## 2024-03-07 DIAGNOSIS — W19XXXA Unspecified fall, initial encounter: Secondary | ICD-10-CM | POA: Diagnosis not present

## 2024-03-07 DIAGNOSIS — M25532 Pain in left wrist: Secondary | ICD-10-CM | POA: Diagnosis not present

## 2024-03-07 DIAGNOSIS — M25512 Pain in left shoulder: Secondary | ICD-10-CM | POA: Diagnosis not present

## 2024-08-06 ENCOUNTER — Ambulatory Visit: Admitting: Oncology
